# Patient Record
Sex: Male | Born: 1960 | Hispanic: No | Marital: Married | Smoking: Never smoker
Health system: Southern US, Community
[De-identification: ages and names within clinical notes are randomized; demographics above are authoritative.]

## PROBLEM LIST (undated history)

## (undated) DIAGNOSIS — I10 Essential (primary) hypertension: Secondary | ICD-10-CM

## (undated) DIAGNOSIS — A4902 Methicillin resistant Staphylococcus aureus infection, unspecified site: Secondary | ICD-10-CM

## (undated) DIAGNOSIS — G4730 Sleep apnea, unspecified: Secondary | ICD-10-CM

## (undated) DIAGNOSIS — K219 Gastro-esophageal reflux disease without esophagitis: Secondary | ICD-10-CM

## (undated) DIAGNOSIS — K439 Ventral hernia without obstruction or gangrene: Secondary | ICD-10-CM

## (undated) HISTORY — PX: HERNIA REPAIR: SHX51

## (undated) HISTORY — DX: Ventral hernia without obstruction or gangrene: K43.9

## (undated) HISTORY — PX: TONSILLECTOMY: SUR1361

## (undated) HISTORY — PX: APPENDECTOMY: SHX54

---

## 2005-10-14 DIAGNOSIS — A4902 Methicillin resistant Staphylococcus aureus infection, unspecified site: Secondary | ICD-10-CM

## 2005-10-14 HISTORY — PX: TOE AMPUTATION: SHX809

## 2005-10-14 HISTORY — DX: Methicillin resistant Staphylococcus aureus infection, unspecified site: A49.02

## 2008-10-14 HISTORY — PX: UMBILICAL HERNIA REPAIR: SHX196

## 2010-10-15 LAB — HM COLONOSCOPY: HM Colonoscopy: NORMAL

## 2013-02-19 ENCOUNTER — Ambulatory Visit: Payer: Self-pay | Admitting: Family Medicine

## 2013-04-23 LAB — PSA: PSA: 0.6

## 2013-08-04 ENCOUNTER — Ambulatory Visit: Payer: Self-pay | Admitting: Physician Assistant

## 2014-05-28 ENCOUNTER — Ambulatory Visit: Payer: Self-pay | Admitting: Family Medicine

## 2014-05-28 LAB — URINALYSIS, COMPLETE
Bilirubin,UR: NEGATIVE
GLUCOSE, UR: NEGATIVE
Ketone: NEGATIVE
NITRITE: POSITIVE
Ph: 6 (ref 5.0–8.0)
RBC,UR: >30 /HPF (ref 0–5)
Specific Gravity: 1.025 (ref 1.000–1.030)
Squamous Epithelial: NONE SEEN

## 2014-05-28 LAB — OCCULT BLOOD X 1 CARD TO LAB, STOOL: Occult Blood, Feces: NEGATIVE

## 2014-05-30 LAB — URINE CULTURE

## 2014-06-07 ENCOUNTER — Ambulatory Visit: Payer: Self-pay | Admitting: Emergency Medicine

## 2014-06-09 DIAGNOSIS — N3000 Acute cystitis without hematuria: Secondary | ICD-10-CM | POA: Insufficient documentation

## 2015-02-24 LAB — LIPID PANEL
CHOLESTEROL: 220 mg/dL — AB (ref 0–200)
HDL: 59 mg/dL (ref 35–70)
LDL CALC: 137 mg/dL
TRIGLYCERIDES: 121 mg/dL (ref 40–160)

## 2015-02-24 LAB — TSH: TSH: 1.4 u[IU]/mL (ref <=5.90)

## 2015-02-26 ENCOUNTER — Encounter: Payer: Self-pay | Admitting: Internal Medicine

## 2015-02-26 DIAGNOSIS — D485 Neoplasm of uncertain behavior of skin: Secondary | ICD-10-CM | POA: Insufficient documentation

## 2015-02-26 DIAGNOSIS — Z800 Family history of malignant neoplasm of digestive organs: Secondary | ICD-10-CM | POA: Insufficient documentation

## 2015-02-26 DIAGNOSIS — N401 Enlarged prostate with lower urinary tract symptoms: Secondary | ICD-10-CM

## 2015-02-26 DIAGNOSIS — N138 Other obstructive and reflux uropathy: Secondary | ICD-10-CM | POA: Insufficient documentation

## 2015-02-26 DIAGNOSIS — I10 Essential (primary) hypertension: Secondary | ICD-10-CM | POA: Insufficient documentation

## 2015-05-19 ENCOUNTER — Encounter: Payer: Self-pay | Admitting: Emergency Medicine

## 2015-05-19 ENCOUNTER — Ambulatory Visit
Admission: EM | Admit: 2015-05-19 | Discharge: 2015-05-19 | Disposition: A | Discharge status: Home / Self Care | Payer: BLUE CROSS/BLUE SHIELD | Attending: Family Medicine | Admitting: Family Medicine

## 2015-05-19 DIAGNOSIS — R42 Dizziness and giddiness: Secondary | ICD-10-CM | POA: Diagnosis not present

## 2015-05-19 DIAGNOSIS — H6982 Other specified disorders of Eustachian tube, left ear: Secondary | ICD-10-CM | POA: Diagnosis not present

## 2015-05-19 HISTORY — DX: Essential (primary) hypertension: I10

## 2015-05-19 MED ORDER — MECLIZINE HCL 25 MG PO TABS: 25.0000 mg | ORAL_TABLET | Freq: Three times a day (TID) | ORAL | Status: DC | PRN

## 2015-05-19 MED ORDER — FEXOFENADINE-PSEUDOEPHED ER 180-240 MG PO TB24: 1.0000 | ORAL_TABLET | Freq: Every day | ORAL | Status: DC

## 2015-05-19 MED ORDER — PREDNISONE 10 MG (21) PO TBPK: ORAL_TABLET | ORAL | Status: DC

## 2015-05-19 NOTE — ED Provider Notes (Signed)
CSN: [id number]     Arrival date & time 05/19/15  0702 History   First MD Initiated Contact with Patient 05/19/15 [phone]     Chief Complaint  Patient presents with  . Facial Pain   (Consider location/radiation/quality/duration/timing/severity/associated sxs/prior Treatment) Patient is a 54 y.o. male presenting with URI. The history is provided by the patient. No language interpreter was used.  URI Presenting symptoms: congestion, cough and ear pain   Presenting symptoms: no facial pain, no fatigue, no fever and no sore throat   Congestion:    Location:  Nasal   Interferes with sleep: no     Interferes with eating/drinking: no   Cough:    Cough characteristics:  Non-productive   Sputum characteristics:  Nondescript   Severity:  Mild   Onset quality:  Unable to specify   Timing:  Intermittent   Progression:  Waxing and waning Severity:  Moderate (Pressure in the L ear) Onset quality:  Unable to specify Timing:  Intermittent Progression:  Waxing and waning Chronicity:  New Relieved by:  Nothing Worsened by:  Nothing tried (flonase) Ineffective treatments: nasal spray. Associated symptoms: no sinus pain   Associated symptoms comment:  He reports having dizzyness in the AM the last three mornings. 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 Father w/colon CA . He has had a colonoscopy.  Past Medical History  Diagnosis Date  . Hypertension    Past Surgical History  Procedure Laterality Date  . 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 Appendectomy    . Tonsillectomy    . Umbilical hernia repair     Family History  Problem Relation Age of Onset  . Prostate cancer Father   . Colon cancer Father    History  Substance Use Topics  . Smoking status: Never Smoker   . Smokeless tobacco: Not on file  . Alcohol Use: 6.0 oz/week    10 Standard drinks or equivalent per week    Review of  Systems  Constitutional: Negative for fever and fatigue.  HENT: Positive for congestion, ear pain and postnasal drip. Negative for sinus pressure and sore throat.   Respiratory: Positive for cough.   All other systems reviewed and are negative.   Allergies  Codeine  Home Medications   Prior to Admission medications   Medication Sig Start Date End Date Taking? Authorizing Provider  fexofenadine-pseudoephedrine (ALLEGRA-D ALLERGY & CONGESTION) 180-240 MG per 24 hr tablet Take 1 tablet by mouth daily. 05/19/15   Frederich Cha, MD  fluticasone (FLONASE) 50 MCG/ACT nasal spray Place 2 sprays into the nose daily. 11/05/13   Historical Provider, MD  lisinopril (PRINIVIL,ZESTRIL) 10 MG tablet Take 1 tablet by mouth daily. 02/23/15   Historical Provider, MD  meclizine (ANTIVERT) 25 MG tablet Take 1 tablet (25 mg total) by mouth 3 (three) times daily as needed for dizziness. 05/19/15   Frederich Cha, MD  predniSONE (STERAPRED UNI-PAK 21 TAB) 10 MG (21) TBPK tablet Sig 6 tablet day 1, 5 tablets day 2, 4 tablets day 3,,3tablets day 4, 2 tablets day 5, 1 tablet day 6 take all tablets orally 05/19/15   Frederich Cha, MD  tamsulosin (FLOMAX) 0.4 MG CAPS capsule Take 0.4 mg by mouth daily as needed.    Historical Provider, MD   BP 129/92 mmHg  Pulse 50  Temp(Src) 96.5 F (35.8 C) (Tympanic)  Resp  16  Ht 5\' 9"  (1.753 m)  Wt 200 lb (90.719 kg)  BMI 29.52 kg/m2  SpO2 98% Physical Exam  ED Course  Procedures (including critical care time) Labs Review Labs Reviewed - No data to display  Imaging Review No results found.   MDM   1. Eustachian tube dysfunction, left   2. Vertigo    Will hold off on any antibiotic since this has been only 3-4 days in occurrence and not constant. Informed if not better in 7-10 days may at that time need an antibiotic.  Since he claims he has been using his flonase regularly will add a 6 day course of prednisone and antivert for his dizziness,.    Frederich Cha, MD 05/23/15  1153

## 2015-05-19 NOTE — Discharge Instructions (Signed)
Dizziness  Dizziness means you feel unsteady or lightheaded. You might feel like you are going to pass out (faint). HOME CARE   Drink enough fluids to keep your pee (urine) clear or pale yellow.  Take your medicines exactly as told by your doctor. If you take blood pressure medicine, always stand up slowly from the lying or sitting position. Hold on to something to steady yourself.  If you need to stand in one place for a long time, move your legs often. Tighten and relax your leg muscles.  Have someone stay with you until you feel okay.  Do not drive or use heavy machinery if you feel dizzy.  Do not drink alcohol. GET HELP RIGHT AWAY IF:   You feel dizzy or lightheaded and it gets worse.  You feel sick to your stomach (nauseous), or you throw up (vomit).  You have trouble talking or walking.  You feel weak or have trouble using your arms, hands, or legs.  You cannot think clearly or have trouble forming sentences.  You have chest pain, belly (abdominal) pain, sweating, or you are short of breath.  Your vision changes.  You are bleeding.  You have problems from your medicine that seem to be getting worse. MAKE SURE YOU:   Understand these instructions.  Will watch your condition.  Will get help right away if you are not doing well or get worse. Document Released: 09/19/2011 Document Revised: 12/23/2011 Document Reviewed: 09/19/2011 [facility] Patient Information 2015 [place], [place]. This information is not intended to replace advice given to you by your health care provider. Make sure you discuss any questions you have with your health care provider. Barotitis Media Barotitis media is inflammation of your middle ear. This occurs when the auditory tube (eustachian tube) leading from the back of your nose (nasopharynx) to your eardrum is blocked. This blockage may result from a cold, environmental allergies, or an upper respiratory infection. Unresolved barotitis media may  lead to damage or hearing loss (barotrauma), which may become permanent. HOME CARE INSTRUCTIONS   Use medicines as recommended by your health care provider. Over-the-counter medicines will help unblock the canal and can help during times of air travel.  Do not put anything into your ears to clean or unplug them. Eardrops will not be helpful.  Do not swim, dive, or fly until your health care provider says it is all right to do so. If these activities are necessary, chewing gum with frequent, forceful swallowing may help. It is also helpful to hold your nose and gently blow to pop your ears for equalizing pressure changes. This forces air into the eustachian tube.  Only take over-the-counter or prescription medicines for pain, discomfort, or fever as directed by your health care provider.  A decongestant may be helpful in decongesting the middle ear and make pressure equalization easier. SEEK MEDICAL CARE IF:  You experience a serious form of dizziness in which you feel as if the room is spinning and you feel nauseated (vertigo).  Your symptoms only involve one ear. SEEK IMMEDIATE MEDICAL CARE IF:   You develop a severe headache, dizziness, or severe ear pain.  You have bloody or pus-like drainage from your ears.  You develop a fever.  Your problems do not improve or become worse. MAKE SURE YOU:   Understand these instructions.  Will watch your condition.  Will get help right away if you are not doing well or get worse. Document Released: 09/27/2000 Document Revised: 07/21/2013 Document Reviewed: 04/27/2013  Exit[employer]® Patient Information ©2015 [employer]. This information is not intended to replace advice given to you by your health care provider. Make sure you discuss any questions you have with your health care provider. ° °

## 2015-05-19 NOTE — ED Notes (Signed)
Patient c/o sinus congestion and pressure for the past 3 days. Patient denies fevers.

## 2015-05-26 ENCOUNTER — Other Ambulatory Visit: Payer: Self-pay

## 2015-07-21 ENCOUNTER — Ambulatory Visit (INDEPENDENT_AMBULATORY_CARE_PROVIDER_SITE_OTHER): Payer: BLUE CROSS/BLUE SHIELD | Admitting: Internal Medicine

## 2015-07-21 ENCOUNTER — Encounter: Payer: Self-pay | Admitting: Internal Medicine

## 2015-07-21 VITALS — BP 104/70 | HR 68 | Ht 69.0 in | Wt 208.4 lb

## 2015-07-21 DIAGNOSIS — K439 Ventral hernia without obstruction or gangrene: Secondary | ICD-10-CM

## 2015-07-21 NOTE — Progress Notes (Signed)
Date:  07/21/2015   Name:  Isaac Banks   DOB:  1960-11-29   MRN:  [id number]   Chief Complaint: Hernia  Patient states about 10 years ago he had umbilical hernia repair with mesh. He had done well until several weeks ago he noticed a fullness and discomfort to the right side of his umbilicus. He has been trying to do more exercise and doing some sit ups. He does not recall any other specific injury. He has a tendency to be slightly constipated and has been trying to eat more salads. The discomfort is intermittent. The fullness is improved in the morning after he's been sleeping. He is concerned that it may progress and wants to have it evaluated.   Review of Systems  Constitutional: Negative for fever and chills.  Cardiovascular: Negative for chest pain.  Gastrointestinal: Positive for abdominal pain (mild intermittent discomfort), constipation and abdominal distention. Negative for vomiting and blood in stool.    Patient Active Problem List   Diagnosis Date Noted  . Benign prostatic hyperplasia with urinary obstruction 02/26/2015  . Essential (primary) hypertension 02/26/2015  . Family history of colon cancer 02/26/2015  . Neoplasm of uncertain behavior of skin 02/26/2015  . Acute cystitis 06/09/2014    Prior to Admission medications   Medication Sig Start Date End Date Taking? Authorizing Provider  fexofenadine-pseudoephedrine (ALLEGRA-D ALLERGY & CONGESTION) 180-240 MG per 24 hr tablet Take 1 tablet by mouth daily. 05/19/15  Yes Frederich Cha, MD  fluticasone [facility]) 50 MCG/ACT nasal spray Place 2 sprays into the nose daily. 11/05/13  Yes Historical Provider, MD  lisinopril (PRINIVIL,ZESTRIL) 10 MG tablet Take 1 tablet by mouth daily. 02/23/15  Yes Historical Provider, MD  sildenafil (REVATIO) 20 MG tablet 3-5 tablets po daily as needed 01/06/15  Yes Historical Provider, MD  tamsulosin (FLOMAX) 0.4 MG CAPS capsule Take 0.4 mg by mouth daily as needed.   Yes Historical Provider, MD     Allergies  Allergen Reactions  . Codeine     Past Surgical History  Procedure Laterality Date  . 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 Appendectomy    . Tonsillectomy    . Umbilical hernia repair      Social History  Substance Use Topics  . Smoking status: Never Smoker   . Smokeless tobacco: None  . Alcohol Use: 6.0 oz/week    10 Standard drinks or equivalent per week     Medication list has been reviewed and updated.  Physical Examination:  Physical Exam  Constitutional: He appears well-developed and well-nourished.  Cardiovascular: Normal rate and regular rhythm.   Pulmonary/Chest: Effort normal.  Abdominal: Soft. Bowel sounds are normal. There is tenderness in the periumbilical area. A hernia is present. Hernia confirmed positive in the ventral area.      BP 104/70 mmHg  Pulse 68  Ht 5\' 9"  (1.753 m)  Wt 208 lb 6.4 oz (94.53 kg)  BMI 30.76 kg/m2  Assessment and Plan: 1. Ventral hernia without obstruction or gangrene Patient to avoid sit-ups and heavy lifting without support Go to the emergency room if symptoms are acutely worse - Ambulatory referral to [place], MD [facility]  07/21/2015

## 2015-07-26 ENCOUNTER — Ambulatory Visit (INDEPENDENT_AMBULATORY_CARE_PROVIDER_SITE_OTHER): Payer: BLUE CROSS/BLUE SHIELD | Admitting: General Surgery

## 2015-07-26 ENCOUNTER — Encounter: Payer: Self-pay | Admitting: *Deleted

## 2015-07-26 ENCOUNTER — Other Ambulatory Visit: Payer: Self-pay | Admitting: *Deleted

## 2015-07-26 VITALS — BP 122/86 | HR 67 | Temp 98.3°F | Ht 69.0 in | Wt 212.0 lb

## 2015-07-26 DIAGNOSIS — K439 Ventral hernia without obstruction or gangrene: Secondary | ICD-10-CM | POA: Diagnosis not present

## 2015-07-26 NOTE — Progress Notes (Signed)
  Surgical Consultation  07/26/2015  Jasyah Theurer is an 54 y.o. male.   Chief Complaint  Patient presents with  . Hernia    surgical consult     HPI: 54 year old male reports to clinic for evaluation of a ventral hernia. Patient reports that approximately [place] an open umbilical hernia repair with mesh. Approximately 2 months ago while doing sit ups he noticed a painful bulge to the right side of his prior hernia repair. For approximately 10 days the only way to make reduce and feel better was to lay down. Since that time for the last several weeks the area has been soft and easily reducible. It is not currently tender however can become tender if manipulated heavily. He also complains of a fullness below his rib cage bilaterally. He denies any fevers, chills, nausea, vomiting, chest pain, shortness of breath, diarrhea, constipation. Otherwise in his usual state of good health.  Past Medical History  Diagnosis Date  . Hypertension   . Ventral hernia     Past Surgical History  Procedure Laterality Date  . 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 Appendectomy    . Tonsillectomy    . Umbilical hernia repair  2010    in [place]    Family History  Problem Relation Age of Onset  . Prostate cancer Father   . Colon cancer Father     Social History:  reports that he has never smoked. He has never used smokeless tobacco. He reports that he drinks about 6.0 oz of alcohol per week. He reports that he does not use illicit drugs.  Allergies:  Allergies  Allergen Reactions  . Codeine     Medications reviewed.     ROS A multisystem review of systems was completed. All pertinent positives negatives within the history of present illness the remainder negative.    BP 122/86 mmHg  Pulse 67  Temp(Src) 98.3 F (36.8 C) (Oral)  Ht 5\' 9"  (1.753 m)  Wt 96.163 kg (212 lb)  BMI 31.29 kg/m2  Physical Exam Gen.: No acute distress Chest: Clear  to auscultation  heart: Regular rate and rhythm Abdomen: Soft, nontender, nondistended. Palpable hernia to the right of prior umbilical hernia repair. This is easily reducible however difficult to ascertain fascial edges due to body habitus.   No results found for this or any previous visit (from the past 48 hour(s)). No results found.  Assessment/Plan: 1. Ventral hernia without obstruction or gangrene 54 year old male with a symptomatic ventral hernia. Discussed the surgical options of open versus laparoscopic repair. Went into detail into the procedures of both along with the risks, benefits, alternatives. Patient voiced understanding and stated desire to have the area fixed so that he could return to working out. Patient desires to have a laparoscopic ventral hernia repair. We'll plan for repair on October 28.   [name]

## 2015-07-26 NOTE — Patient Instructions (Signed)
You will be receiving a call from our surgery scheduler in the next few days detailing the date and time of your procedure. If you have any questions call Angie at [phone].

## 2015-07-27 ENCOUNTER — Telehealth: Payer: Self-pay | Admitting: General Surgery

## 2015-07-27 NOTE — Telephone Encounter (Signed)
Pt advised of pre op date/time and sx date. Sx: 08/11/15 with Dr Samuel Germany ventral hernia Pre op: 07/28/15 between 9-1:00pm--Phone.

## 2015-07-28 ENCOUNTER — Inpatient Hospital Stay: Admission: RE | Admit: 2015-07-28 | Payer: BLUE CROSS/BLUE SHIELD | Source: Ambulatory Visit

## 2015-07-28 ENCOUNTER — Other Ambulatory Visit: Payer: BLUE CROSS/BLUE SHIELD

## 2015-07-28 ENCOUNTER — Encounter: Payer: Self-pay | Admitting: *Deleted

## 2015-07-28 NOTE — Patient Instructions (Signed)
  Your procedure is scheduled on: 08-11-15 (FRIDAY) Report to [facility] To find out your arrival time please call [phone] between 1PM - 3PM on 08-10-15 (THURSDAY)  Remember: Instructions that are not followed completely may result in serious medical risk, up to and including death, or upon the discretion of your surgeon and anesthesiologist your surgery may need to be rescheduled.    _X___ 1. Do not eat food or drink liquids after midnight. No gum chewing or hard candies.     _X___ 2. No Alcohol for 24 hours before or after surgery.   ____ 3. Bring all medications with you on the day of surgery if instructed.    _X___ 4. Notify your doctor if there is any change in your medical condition     (cold, fever, infections).     Do not wear jewelry, make-up, hairpins, clips or nail polish.  Do not wear lotions, powders, or perfumes. You may wear deodorant.  Do not shave 48 hours prior to surgery. Men may shave face and neck.  Do not bring valuables to the hospital.    [facility] is not responsible for any belongings or valuables.               Contacts, dentures or bridgework may not be worn into surgery.  Leave your suitcase in the car. After surgery it may be brought to your room.  For patients admitted to the hospital, discharge time is determined by your  treatment team.   Patients discharged the day of surgery will not be allowed to drive home.   Please read over the following fact sheets that you were given:     _X___ Take these medicines the morning of surgery with A SIP OF WATER:    1. LISINOPRIL  2.   3.   4.  5.  6.  ____ Fleet Enema (as directed)   _X___ Use CHG Soap as directed  ____ Use inhalers on the day of surgery  ____ Stop metformin 2 days prior to surgery    ____ Take 1/2 of usual insulin dose the night before surgery and none on the morning of surgery.   ____ Stop Coumadin/Plavix/aspirin-N/A  ____ Stop  Anti-inflammatories-NO NSAIDS OR ASPIRIN PRODUCTS-TYLENOL OK   _X___ Stop supplements until after surgery-STOP PROBIOTIC NOW   ____ Bring C-Pap to the hospital.

## 2015-08-04 ENCOUNTER — Ambulatory Visit
Admission: RE | Admit: 2015-08-04 | Discharge: 2015-08-04 | Disposition: A | Discharge status: Home / Self Care | Payer: BLUE CROSS/BLUE SHIELD | Source: Ambulatory Visit | Attending: General Surgery | Admitting: General Surgery

## 2015-08-04 ENCOUNTER — Encounter
Admission: RE | Admit: 2015-08-04 | Discharge: 2015-08-04 | Disposition: A | Discharge status: Home / Self Care | Payer: BLUE CROSS/BLUE SHIELD | Source: Ambulatory Visit | Attending: General Surgery | Admitting: General Surgery

## 2015-08-04 DIAGNOSIS — Z01810 Encounter for preprocedural cardiovascular examination: Secondary | ICD-10-CM | POA: Insufficient documentation

## 2015-08-04 DIAGNOSIS — I517 Cardiomegaly: Secondary | ICD-10-CM | POA: Diagnosis not present

## 2015-08-04 LAB — BASIC METABOLIC PANEL
ANION GAP: 6 (ref 5–15)
BUN: 19 mg/dL (ref 6–20)
CHLORIDE: 107 mmol/L (ref 101–111)
CO2: 26 mmol/L (ref 22–32)
Calcium: 9.1 mg/dL (ref 8.9–10.3)
Creatinine, Ser: 1.02 mg/dL (ref 0.61–1.24)
GFR calc Af Amer: >60 mL/min (ref >60)
GFR calc non Af Amer: >60 mL/min (ref >60)
GLUCOSE: 101 mg/dL — AB (ref 65–99)
Potassium: 4.3 mmol/L (ref 3.5–5.1)
Sodium: 139 mmol/L (ref 135–145)

## 2015-08-04 LAB — CBC WITH DIFFERENTIAL/PLATELET
Basophils Absolute: 0.1 10*3/uL (ref 0–0.1)
Basophils Relative: 1 %
EOS PCT: 4 %
Eosinophils Absolute: 0.5 10*3/uL (ref 0–0.7)
HCT: 45.8 % (ref 40.0–52.0)
Hemoglobin: 15.4 g/dL (ref 13.0–18.0)
LYMPHS ABS: 3 10*3/uL (ref 1.0–3.6)
LYMPHS PCT: 29 %
MCH: 29.5 pg (ref 26.0–34.0)
MCHC: 33.5 g/dL (ref 32.0–36.0)
MCV: 88.1 fL (ref 80.0–100.0)
MONO ABS: 0.7 10*3/uL (ref 0.2–1.0)
Monocytes Relative: 7 %
Neutro Abs: 6.2 10*3/uL (ref 1.4–6.5)
Neutrophils Relative %: 59 %
Platelets: 247 10*3/uL (ref 150–440)
RBC: 5.21 MIL/uL (ref 4.40–5.90)
RDW: 12.9 % (ref 11.5–14.5)
WBC: 10.5 10*3/uL (ref 3.8–10.6)

## 2015-08-04 LAB — PROTIME-INR
INR: 0.95
Prothrombin Time: 12.9 seconds (ref 11.4–15.0)

## 2015-08-04 LAB — SURGICAL PCR SCREEN
MRSA, PCR: NEGATIVE
Staphylococcus aureus: NEGATIVE

## 2015-08-04 LAB — APTT: aPTT: 29 seconds (ref 24–36)

## 2015-08-10 ENCOUNTER — Encounter: Payer: Self-pay | Admitting: Internal Medicine

## 2015-08-10 DIAGNOSIS — I517 Cardiomegaly: Secondary | ICD-10-CM | POA: Insufficient documentation

## 2015-08-10 NOTE — Pre-Procedure Instructions (Signed)
Surgeon ordered a CXR on pt preop and it came back that pt has Cardiomegaly.  Pt does have htn and sleep apnea and does not wear his cpap-faxed results to Dr Army Melia (pcp) just for her to have in her records

## 2015-08-11 ENCOUNTER — Ambulatory Visit: Payer: BLUE CROSS/BLUE SHIELD | Admitting: Certified Registered Nurse Anesthetist

## 2015-08-11 ENCOUNTER — Encounter
Admission: RE | Disposition: A | Discharge status: Home / Self Care | Payer: Self-pay | Source: Ambulatory Visit | Attending: General Surgery

## 2015-08-11 ENCOUNTER — Ambulatory Visit
Admission: RE | Admit: 2015-08-11 | Discharge: 2015-08-11 | Disposition: A | Discharge status: Home / Self Care | Payer: BLUE CROSS/BLUE SHIELD | Source: Ambulatory Visit | Attending: General Surgery | Admitting: General Surgery

## 2015-08-11 ENCOUNTER — Encounter: Payer: Self-pay | Admitting: *Deleted

## 2015-08-11 DIAGNOSIS — K458 Other specified abdominal hernia without obstruction or gangrene: Secondary | ICD-10-CM | POA: Diagnosis not present

## 2015-08-11 DIAGNOSIS — Z8614 Personal history of Methicillin resistant Staphylococcus aureus infection: Secondary | ICD-10-CM | POA: Insufficient documentation

## 2015-08-11 DIAGNOSIS — I10 Essential (primary) hypertension: Secondary | ICD-10-CM | POA: Diagnosis not present

## 2015-08-11 DIAGNOSIS — Z9049 Acquired absence of other specified parts of digestive tract: Secondary | ICD-10-CM | POA: Diagnosis not present

## 2015-08-11 DIAGNOSIS — G4730 Sleep apnea, unspecified: Secondary | ICD-10-CM | POA: Diagnosis not present

## 2015-08-11 DIAGNOSIS — Z800 Family history of malignant neoplasm of digestive organs: Secondary | ICD-10-CM | POA: Insufficient documentation

## 2015-08-11 DIAGNOSIS — Z79899 Other long term (current) drug therapy: Secondary | ICD-10-CM | POA: Insufficient documentation

## 2015-08-11 DIAGNOSIS — Z89422 Acquired absence of other left toe(s): Secondary | ICD-10-CM | POA: Insufficient documentation

## 2015-08-11 DIAGNOSIS — K432 Incisional hernia without obstruction or gangrene: Secondary | ICD-10-CM | POA: Diagnosis not present

## 2015-08-11 DIAGNOSIS — K469 Unspecified abdominal hernia without obstruction or gangrene: Secondary | ICD-10-CM | POA: Insufficient documentation

## 2015-08-11 DIAGNOSIS — Z8042 Family history of malignant neoplasm of prostate: Secondary | ICD-10-CM | POA: Insufficient documentation

## 2015-08-11 HISTORY — DX: Sleep apnea, unspecified: G47.30

## 2015-08-11 HISTORY — DX: Methicillin resistant Staphylococcus aureus infection, unspecified site: A49.02

## 2015-08-11 HISTORY — PX: VENTRAL HERNIA REPAIR: SHX424

## 2015-08-11 SURGERY — REPAIR, HERNIA, VENTRAL, LAPAROSCOPIC
Anesthesia: General | Wound class: Clean

## 2015-08-11 MED ORDER — MIDAZOLAM HCL 2 MG/2ML IJ SOLN
INTRAMUSCULAR | Status: DC | PRN
  Administered 2015-08-11: 2 mg via INTRAVENOUS

## 2015-08-11 MED ORDER — ROCURONIUM BROMIDE 100 MG/10ML IV SOLN
INTRAVENOUS | Status: DC | PRN
  Administered 2015-08-11: 10 mg via INTRAVENOUS
  Administered 2015-08-11: 15 mg via INTRAVENOUS

## 2015-08-11 MED ORDER — HEPARIN SODIUM (PORCINE) 5000 UNIT/ML IJ SOLN
INTRAMUSCULAR | Status: AC
Start: 2015-08-11 — End: 2015-08-11
  Filled 2015-08-11: qty 1

## 2015-08-11 MED ORDER — SODIUM CHLORIDE 0.9 % IJ SOLN
INTRAMUSCULAR | Status: AC
  Filled 2015-08-11: qty 10

## 2015-08-11 MED ORDER — LIDOCAINE HCL 1 % IJ SOLN
INTRAMUSCULAR | Status: DC | PRN
  Administered 2015-08-11: 10 mL

## 2015-08-11 MED ORDER — FENTANYL CITRATE (PF) 100 MCG/2ML IJ SOLN
INTRAMUSCULAR | Status: DC | PRN
  Administered 2015-08-11: 100 ug via INTRAVENOUS

## 2015-08-11 MED ORDER — FENTANYL CITRATE (PF) 100 MCG/2ML IJ SOLN
INTRAMUSCULAR | Status: AC
  Filled 2015-08-11: qty 2

## 2015-08-11 MED ORDER — CHLORHEXIDINE GLUCONATE 4 % EX LIQD: 1.0000 "application " | Freq: Once | CUTANEOUS | Status: DC

## 2015-08-11 MED ORDER — DEXAMETHASONE SODIUM PHOSPHATE 4 MG/ML IJ SOLN
INTRAMUSCULAR | Status: DC | PRN
  Administered 2015-08-11: 5 mg via INTRAVENOUS

## 2015-08-11 MED ORDER — FENTANYL CITRATE (PF) 100 MCG/2ML IJ SOLN
25.0000 ug | INTRAMUSCULAR | Status: DC | PRN
  Administered 2015-08-11 (×3): 25 ug via INTRAVENOUS

## 2015-08-11 MED ORDER — CEFAZOLIN SODIUM-DEXTROSE 2-3 GM-% IV SOLR
2.0000 g | INTRAVENOUS | Status: AC
  Administered 2015-08-11: 2 g via INTRAVENOUS

## 2015-08-11 MED ORDER — KETOROLAC TROMETHAMINE 30 MG/ML IJ SOLN
INTRAMUSCULAR | Status: DC | PRN
  Administered 2015-08-11: 30 mg via INTRAVENOUS

## 2015-08-11 MED ORDER — DOCUSATE SODIUM 100 MG PO CAPS: 100.0000 mg | ORAL_CAPSULE | Freq: Two times a day (BID) | ORAL | Status: DC

## 2015-08-11 MED ORDER — OXYCODONE HCL 5 MG/5ML PO SOLN: 5.0000 mg | Freq: Once | ORAL | Status: DC | PRN

## 2015-08-11 MED ORDER — PROPOFOL 10 MG/ML IV BOLUS
INTRAVENOUS | Status: DC | PRN
  Administered 2015-08-11: 200 mg via INTRAVENOUS

## 2015-08-11 MED ORDER — GLYCOPYRROLATE 0.2 MG/ML IJ SOLN
INTRAMUSCULAR | Status: DC | PRN
  Administered 2015-08-11: 0.4 mg via INTRAVENOUS

## 2015-08-11 MED ORDER — LIDOCAINE HCL (PF) 1 % IJ SOLN
INTRAMUSCULAR | Status: AC
  Filled 2015-08-11: qty 30

## 2015-08-11 MED ORDER — FAMOTIDINE 20 MG PO TABS
20.0000 mg | ORAL_TABLET | Freq: Once | ORAL | Status: AC
  Administered 2015-08-11: 20 mg via ORAL

## 2015-08-11 MED ORDER — ONDANSETRON 4 MG PO TBDP
4.0000 mg | ORAL_TABLET | Freq: Three times a day (TID) | ORAL | Status: DC | PRN
Start: 2015-08-11 — End: 2015-08-17

## 2015-08-11 MED ORDER — BUPIVACAINE HCL (PF) 0.5 % IJ SOLN
INTRAMUSCULAR | Status: AC
  Filled 2015-08-11: qty 30

## 2015-08-11 MED ORDER — PROMETHAZINE HCL 25 MG/ML IJ SOLN
INTRAMUSCULAR | Status: AC
  Administered 2015-08-11: 12.5 mg via INTRAVENOUS
  Filled 2015-08-11: qty 1

## 2015-08-11 MED ORDER — NEOSTIGMINE METHYLSULFATE 10 MG/10ML IV SOLN
INTRAVENOUS | Status: DC | PRN
  Administered 2015-08-11: 3 mg via INTRAVENOUS

## 2015-08-11 MED ORDER — ACETAMINOPHEN 10 MG/ML IV SOLN
INTRAVENOUS | Status: AC
  Filled 2015-08-11: qty 100

## 2015-08-11 MED ORDER — BUPIVACAINE HCL (PF) 0.5 % IJ SOLN
INTRAMUSCULAR | Status: DC | PRN
  Administered 2015-08-11: 10 mL

## 2015-08-11 MED ORDER — OXYCODONE HCL 5 MG PO TABS: 5.0000 mg | ORAL_TABLET | Freq: Once | ORAL | Status: DC | PRN

## 2015-08-11 MED ORDER — LACTATED RINGERS IV SOLN
INTRAVENOUS | Status: DC
  Administered 2015-08-11: 08:00:00 via INTRAVENOUS

## 2015-08-11 MED ORDER — PROMETHAZINE HCL 25 MG/ML IJ SOLN
12.5000 mg | Freq: Once | INTRAMUSCULAR | Status: AC
  Administered 2015-08-11: 12.5 mg via INTRAVENOUS

## 2015-08-11 MED ORDER — ONDANSETRON HCL 4 MG/2ML IJ SOLN
INTRAMUSCULAR | Status: DC | PRN
  Administered 2015-08-11: 4 mg via INTRAVENOUS

## 2015-08-11 MED ORDER — CEFAZOLIN SODIUM-DEXTROSE 2-3 GM-% IV SOLR
INTRAVENOUS | Status: AC
  Filled 2015-08-11: qty 50

## 2015-08-11 MED ORDER — SUCCINYLCHOLINE CHLORIDE 20 MG/ML IJ SOLN
INTRAMUSCULAR | Status: DC | PRN
  Administered 2015-08-11: 140 mg via INTRAVENOUS

## 2015-08-11 MED ORDER — LIDOCAINE HCL (CARDIAC) 20 MG/ML IV SOLN
INTRAVENOUS | Status: DC | PRN
  Administered 2015-08-11: 100 mg via INTRAVENOUS

## 2015-08-11 MED ORDER — HYDROCODONE-ACETAMINOPHEN 5-325 MG PO TABS: 1.0000 | ORAL_TABLET | Freq: Four times a day (QID) | ORAL | Status: DC | PRN

## 2015-08-11 MED ORDER — ACETAMINOPHEN 10 MG/ML IV SOLN
INTRAVENOUS | Status: DC | PRN
  Administered 2015-08-11: 1000 mg via INTRAVENOUS

## 2015-08-11 MED ORDER — FAMOTIDINE 20 MG PO TABS
ORAL_TABLET | ORAL | Status: AC
  Filled 2015-08-11: qty 1

## 2015-08-11 SURGICAL SUPPLY — 34 items
CANISTER SUCT 1200ML W/VALVE (MISCELLANEOUS) ×3 IMPLANT
CANNULA DILATOR  5MM W/SLV (CANNULA) ×1
CANNULA DILATOR 12 W/SLV (CANNULA) ×2 IMPLANT
CANNULA DILATOR 12MM W/SLV (CANNULA) ×1
CANNULA DILATOR 5 W/SLV (CANNULA) ×2 IMPLANT
CATH TRAY 16F METER LATEX (MISCELLANEOUS) IMPLANT
CHLORAPREP W/TINT 26ML (MISCELLANEOUS) ×3 IMPLANT
DEVICE SECURE STRAP 25 ABSORB (INSTRUMENTS) ×3 IMPLANT
GAUZE SPONGE 4X4 12PLY STRL (GAUZE/BANDAGES/DRESSINGS) ×3 IMPLANT
GLOVE BIO SURGEON STRL SZ7.5 (GLOVE) ×3 IMPLANT
GOWN STRL REUS W/ TWL LRG LVL3 (GOWN DISPOSABLE) ×2 IMPLANT
GOWN STRL REUS W/TWL LRG LVL3 (GOWN DISPOSABLE) ×4
IRRIGATION STRYKERFLOW (MISCELLANEOUS) IMPLANT
IRRIGATOR STRYKERFLOW (MISCELLANEOUS)
IV NS 1000ML (IV SOLUTION) ×2
IV NS 1000ML BAXH (IV SOLUTION) ×1 IMPLANT
KIT RM TURNOVER STRD PROC AR (KITS) ×3 IMPLANT
LABEL OR SOLS (LABEL) ×3 IMPLANT
MESH VENT ST 7.6CM S CIR (Mesh General) ×3 IMPLANT
NEEDLE FILTER BLUNT 18X 1/2SAF (NEEDLE) ×2
NEEDLE FILTER BLUNT 18X1 1/2 (NEEDLE) ×1 IMPLANT
NS IRRIG 500ML POUR BTL (IV SOLUTION) ×3 IMPLANT
PACK LAP CHOLECYSTECTOMY (MISCELLANEOUS) ×3 IMPLANT
PAD GROUND ADULT SPLIT (MISCELLANEOUS) ×3 IMPLANT
SCISSORS METZENBAUM CVD 33 (INSTRUMENTS) ×3 IMPLANT
SEAL FOR SCOPE WARMER C3101 (MISCELLANEOUS) IMPLANT
SUT ETHILON 5-0 FS-2 18 BLK (SUTURE) IMPLANT
SUT VIC AB 0 CT2 27 (SUTURE) ×3 IMPLANT
SUT VIC AB 2-0 CT2 27 (SUTURE) IMPLANT
SYR 5ML LL (SYRINGE) ×3 IMPLANT
TACKER 5MM HERNIA 3.5CML NAB (ENDOMECHANICALS) IMPLANT
TROCAR XCEL NON-BLD 11X100MML (ENDOMECHANICALS) ×3 IMPLANT
TUBING INSUFFLATOR HI FLOW (MISCELLANEOUS) ×3 IMPLANT
WATER STERILE IRR 1000ML POUR (IV SOLUTION) ×3 IMPLANT

## 2015-08-11 NOTE — Discharge Instructions (Addendum)
Laparoscopic Ventral Hernia Repair, Care After Refer to this sheet in the next few weeks. These instructions provide you with information on caring for yourself after your procedure. Your caregiver may also give you more specific instructions. Your treatment has been planned according to current medical practices, but problems sometimes occur. Call your caregiver if you have any problems or questions after your procedure.  HOME CARE INSTRUCTIONS   Only take over-the-counter or prescription medicines as directed by your caregiver. If antibiotic medicines are prescribed, take them as directed. Finish them even if you start to feel better.  Always wash your hands before touching your abdomen.  Take your bandages (dressings) off after 48 hours or as directed by your caregiver. You may have skin adhesive strips or skin glue over the surgical cuts (incisions). Do not take the strips off or peel the glue off. These will fall off on their own.  OK to Take showers in 24 hours.Until then, only take sponge baths. Do not take tub baths or go swimming until your caregiver approves.  Check your incision area every day for swelling, redness, warmth, and blood or fluid leaking from the incision. These are signs of infection. Wash your hands before you check.  Hold a pillow over your abdomen when you cough or sneeze to help ease pain.  Eat foods that are high in fiber, such as whole grains, fruits, and vegetables. Fiber helps prevent difficult bowel movements (constipation).  Drink enough fluids to keep your urine clear or pale yellow.  Rest and lessen activity for 4-5 days after the surgery. You may take short walks if your caregiver approves. Do not drive until approved by your caregiver.  Do not lift anything heavy, participate in sports, or have sexual intercourse for 6-8 weeks or until your caregiver approves.   Ask your caregiver when you can return to work.  Keep all follow-up appointments. SEEK  MEDICAL CARE IF:   You have pain even after taking pain medicine.  You have not had a bowel movement in 3 days.  You have cramps or are nauseous. SEEK IMMEDIATE MEDICAL CARE IF:   You have pain or swelling that is getting worse.  You have redness around your incisions.  Your incision is pulling apart.  You have blood or fluid leaking from your incisions.  You are vomiting.  You cannot pass urine. MAKE SURE YOU:   Understand these instructions.  Will watch your condition.  Will get help right away if you are not doing well or get worse.   This information is not intended to replace advice given to you by your health care provider. Make sure you discuss any questions you have with your health care provider.   Document Released: 09/16/2012 Document Revised: 10/21/2014 Document Reviewed: 09/16/2012 Elsevier Interactive Patient Education 2016 [place]   1) The drugs that you were given will stay in your system until tomorrow so for the next 24 hours you should not:  A) Drive an automobile B) Make any legal decisions C) Drink any alcoholic beverage   2) You may resume regular meals tomorrow.  Today it is better to start with liquids and gradually work up to solid foods.  You may eat anything you prefer, but it is better to start with liquids, then soup and crackers, and gradually work up to solid foods.   3) Please notify your doctor immediately if you have any unusual bleeding, trouble breathing,  redness and pain at the surgery site, drainage, fever, or pain not relieved by medication.

## 2015-08-11 NOTE — Anesthesia Preprocedure Evaluation (Addendum)
Anesthesia Evaluation  Patient identified by MRN, date of birth, ID band Patient awake    Reviewed: Allergy & Precautions, H&P , NPO status , Patient's Chart, lab work & pertinent test results  History of Anesthesia Complications (+) history of anesthetic complications (constipation)  Airway Mallampati: III  TM Distance: >3 FB Neck ROM: limited    Dental  (+) Poor Dentition, Chipped   Pulmonary neg shortness of breath, sleep apnea ,    Pulmonary exam normal breath sounds clear to auscultation       Cardiovascular Exercise Tolerance: Good hypertension, (-) angina(-) DOE Normal cardiovascular exam Rhythm:regular Rate:Normal     Neuro/Psych negative neurological ROS  negative psych ROS   GI/Hepatic negative GI ROS, Neg liver ROS,   Endo/Other  negative endocrine ROS  Renal/GU negative Renal ROS  negative genitourinary   Musculoskeletal   Abdominal   Peds  Hematology negative hematology ROS (+)   Anesthesia Other Findings Past Medical History:   Hypertension                                                 Ventral hernia                                               Complication of anesthesia                                   MRSA infection                                  2007         Sleep apnea                                                    Comment:PT DOES NOT USE CPAP  Past Surgical History:   TOE AMPUTATION                                  Left 2007           Comment:2nd, 3rd and 4th- PT HAD 6 SURGERIES ON HIS               FOOT   APPENDECTOMY                                                  TONSILLECTOMY                                                 UMBILICAL HERNIA REPAIR  2010           Comment:in [place]     Reproductive/Obstetrics negative OB ROS                            Anesthesia Physical Anesthesia Plan  ASA: III  Anesthesia Plan:  General ETT   Post-op Pain Management:    Induction:   Airway Management Planned:   Additional Equipment:   Intra-op Plan:   Post-operative Plan:   Informed Consent: I have reviewed the patients History and Physical, chart, labs and discussed the procedure including the risks, benefits and alternatives for the proposed anesthesia with the patient or authorized representative who has indicated his/her understanding and acceptance.   Dental Advisory Given  Plan Discussed with: Anesthesiologist, CRNA and Surgeon  Anesthesia Plan Comments:         Anesthesia Quick Evaluation

## 2015-08-11 NOTE — Transfer of Care (Signed)
Immediate Anesthesia Transfer of Care Note  Patient: Isaac Banks  Procedure(s) Performed: Procedure(s): LAPAROSCOPIC VENTRAL HERNIA WITH MESH  (N/A)  Patient Location: PACU  Anesthesia Type:General  Level of Consciousness: sedated  Airway & Oxygen Therapy: Patient Spontanous Breathing and Patient connected to nasal cannula oxygen  Post-op Assessment: Report given to RN and Post -op Vital signs reviewed and stable  Post vital signs: Reviewed and stable  Last Vitals:  Filed Vitals:   08/11/15 1018  BP: 146/97  Pulse: 57  Temp: 36.1 C  Resp: 12    Complications: No apparent anesthesia complications

## 2015-08-11 NOTE — Anesthesia Postprocedure Evaluation (Signed)
  Anesthesia Post-op Note  Patient: Isaac Banks  Procedure(s) Performed: Procedure(s): LAPAROSCOPIC VENTRAL HERNIA WITH MESH  (N/A)  Anesthesia type:General ETT  Patient location: PACU  Post pain: Pain level controlled  Post assessment: Post-op Vital signs reviewed, Patient's Cardiovascular Status Stable, Respiratory Function Stable, Patent Airway and No signs of Nausea or vomiting  Post vital signs: Reviewed and stable  Last Vitals:  Filed Vitals:   08/11/15 1218  BP: 100/69  Pulse: 70  Temp:   Resp: 14    Level of consciousness: awake, alert  and patient cooperative  Complications: No apparent anesthesia complications

## 2015-08-11 NOTE — Op Note (Signed)
Laparoscopic Abdominal Hernia Repair  Pre-operative Diagnosis: Recurrent Ventral Hernia   Post-operative Diagnosis: Same  Surgeon: Juanda Crumble T. Adonis Huguenin, MD FACS  Anesthesia: Gen. with endotracheal tube  Assistant:None  Procedure Details  The patient was seen again in the Holding Room. The benefits, complications, treatment options, and expected outcomes were discussed with the patient. The risks of bleeding, infection, recurrence of symptoms, failure to resolve symptoms, bowel injury, mesh placement, mesh infection, any of which could require further surgery were reviewed with the patient. The likelihood of improving the patient's symptoms with return to their baseline status is good.  The patient and/or family concurred with the proposed plan, giving informed consent.  The patient was taken to Operating Room, identified as Markelle Najarian and the procedure verified.  A Time Out was held and the above information confirmed.  Prior to the induction of general anesthesia, antibiotic prophylaxis was administered. VTE prophylaxis was in place. General endotracheal anesthesia was then administered and tolerated well. After the induction, the abdomen was prepped with Chloraprep and draped in the sterile fashion. The patient was positioned in the supine position.   Procedure began with a left upper quadrant Veress needle approach. An area in the midclavicular line left quadrant was chosen 2 finger breaths below the most rib. It was localized local anesthetic. Then incised with a 11 blade scalpel and using a Veress needle and pneumoperitoneum was established with 15 mmHg. Then using a 5 mg to be trocar axis of the pneumoperitoneum was established. There is no evidence of damage from the Veress needle or after trocar upon insertion. We then placed 2 additional trochars under direct visualization all on the left lateral axillary line. A mid abdomen 5 only turgor and a left lower quadrant 11 mm trocar. Both under  direct position without any difficulty.   Then turned attention to the mid abdomen where a prior suture repair of a presumed ventral/umbilical hernia was visualized. There was an obvious 1 cm fascial defect just cephalad to this hernia repair. There was an additional area of preperitoneal fat hanging from an opening near that was retracted and removed using electrocautery. This point the decision was made to reinforce the entire area of the prior hernia repair with mesh. A entry oh ST hernia patch was brought into the field that was 4 cm in diameter. A single 0 Vicryl was placed in the center of this mesh. The mesh was rolled up and placed in the abdomen through the left lower quadrant trocar site. Then using a suture grasper the sutures for the mesh was grasped in the center of the hernia and brought up to cover the fascial defect. This was tied down as the lone fascial suture. Then using a secure strap tacking device and was circumferentially secured to the fascia and 2 rings wide of the hernia defect.  The hernia was completely covered pneumoperitoneum was released and there is no evidence of any damage from the surgery. Our trochars removed under direct visualization without evidence of active bleeding and without difficulty. All 4 trochars removed re-localized with the aforementioned local anesthetic. All surgical sites were sealed with a septic a 4-0 Monocryl and sealed with Dermabond.  Patient was awoken from general tracheal anesthesia Without any difficulty. He tolerated procedure well was no immediate evidence, occasion. All counts were correct at the end of case.  Findings:   1 cm recurrent ventral hernia.  Estimated Blood Loss: 5 mL         Drains: None  Specimens: None          Complications: none              Leeah Politano T. Adonis Huguenin, MD, FACS

## 2015-08-11 NOTE — H&P (View-Only) (Signed)
  Surgical Consultation  07/26/2015  Iori Gigante is an 54 y.o. male.   Chief Complaint  Patient presents with  . Hernia    surgical consult     HPI: 54 year old male reports to clinic for evaluation of a ventral hernia. Patient reports that approximately 10 [place] an open umbilical hernia repair with mesh. Approximately 2 months ago while doing sit ups he noticed a painful bulge to the right side of his prior hernia repair. For approximately 10 days the only way to make reduce and feel better was to lay down. Since that time for the last several weeks the area has been soft and easily reducible. It is not currently tender however can become tender if manipulated heavily. He also complains of a fullness below his rib cage bilaterally. He denies any fevers, chills, nausea, vomiting, chest pain, shortness of breath, diarrhea, constipation. Otherwise in his usual state of good health.  Past Medical History  Diagnosis Date  . Hypertension   . Ventral hernia     Past Surgical History  Procedure Laterality Date  . 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 Appendectomy    . Tonsillectomy    . Umbilical hernia repair  2010    in [place]    Family History  Problem Relation Age of Onset  . Prostate cancer Father   . Colon cancer Father     Social History:  reports that he has never smoked. He has never used smokeless tobacco. He reports that he drinks about 6.0 oz of alcohol per week. He reports that he does not use illicit drugs.  Allergies:  Allergies  Allergen Reactions  . Codeine     Medications reviewed.     ROS A multisystem review of systems was completed. All pertinent positives negatives within the history of present illness the remainder negative.    BP 122/86 mmHg  Pulse 67  Temp(Src) 98.3 F (36.8 C) (Oral)  Ht 5\' 9"  (1.753 m)  Wt 96.163 kg (212 lb)  BMI 31.29 kg/m2  Physical Exam Gen.: No acute distress Chest: Clear  to auscultation  heart: Regular rate and rhythm Abdomen: Soft, nontender, nondistended. Palpable hernia to the right of prior umbilical hernia repair. This is easily reducible however difficult to ascertain fascial edges due to body habitus.   No results found for this or any previous visit (from the past 48 hour(s)). No results found.  Assessment/Plan: 1. Ventral hernia without obstruction or gangrene 54 year old male with a symptomatic ventral hernia. Discussed the surgical options of open versus laparoscopic repair. Went into detail into the procedures of both along with the risks, benefits, alternatives. Patient voiced understanding and stated desire to have the area fixed so that he could return to working out. Patient desires to have a laparoscopic ventral hernia repair. We'll plan for repair on October 28.   [name]

## 2015-08-11 NOTE — Anesthesia Procedure Notes (Signed)
Procedure Name: Intubation Date/Time: 08/11/2015 9:16 AM Performed by: Aline Brochure Pre-anesthesia Checklist: Patient identified, Emergency Drugs available, Suction available and Patient being monitored Patient Re-evaluated:Patient Re-evaluated prior to inductionOxygen Delivery Method: Circle system utilized Preoxygenation: Pre-oxygenation with 100% oxygen Intubation Type: IV induction Ventilation: Oral airway inserted - appropriate to patient size Laryngoscope Size: McGraph and 4 Grade View: Grade I Tube type: Oral Tube size: 7.5 mm Number of attempts: 1 Airway Equipment and Method: Patient positioned with wedge pillow,  Video-laryngoscopy and Stylet Placement Confirmation: ETT inserted through vocal cords under direct vision,  positive ETCO2 and breath sounds checked- equal and bilateral Secured at: 22 cm Tube secured with: Tape Dental Injury: Teeth and Oropharynx as per pre-operative assessment

## 2015-08-11 NOTE — Interval H&P Note (Signed)
History and Physical Interval Note:  08/11/2015 8:35 AM  Isaac Banks  has presented today for surgery, with the diagnosis of Ventral Hernia  The various methods of treatment have been discussed with the patient and family. After consideration of risks, benefits and other options for treatment, the patient has consented to  Procedure(s): LAPAROSCOPIC VENTRAL HERNIA (N/A) as a surgical intervention .  The patient's history has been reviewed, patient examined, no change in status, stable for surgery.  I have reviewed the patient's chart and labs.  Questions were answered to the patient's satisfaction.     [name]

## 2015-08-11 NOTE — Brief Op Note (Signed)
08/11/2015  10:13 AM  PATIENT:  Bryson Corona  54 y.o. male  PRE-OPERATIVE DIAGNOSIS:  Ventral Hernia  POST-OPERATIVE DIAGNOSIS:  Ventral Hernia  PROCEDURE:  Procedure(s): LAPAROSCOPIC VENTRAL HERNIA WITH MESH  (N/A)  SURGEON:  Surgeon(s) and Role:    * [name], MD - Primary  PHYSICIAN ASSISTANT:   ASSISTANTS: none   ANESTHESIA:   general  EBL:  Total I/O In: 900 [I.V.:900] Out: 0   BLOOD ADMINISTERED:none  DRAINS: none   LOCAL MEDICATIONS USED:  MARCAINE   , XYLOCAINE  and Amount: 20 ml  SPECIMEN:  No Specimen  DISPOSITION OF SPECIMEN:  N/A  COUNTS:  YES  TOURNIQUET:  * No tourniquets in log *  DICTATION: .Dragon Dictation  PLAN OF CARE: Discharge to home after PACU  PATIENT DISPOSITION:  PACU - hemodynamically stable.   Delay start of Pharmacological VTE agent (>24hrs) due to surgical blood loss or risk of bleeding: not applicable

## 2015-08-14 ENCOUNTER — Encounter: Payer: Self-pay | Admitting: General Surgery

## 2015-08-17 ENCOUNTER — Encounter: Payer: Self-pay | Admitting: General Surgery

## 2015-08-17 ENCOUNTER — Ambulatory Visit (INDEPENDENT_AMBULATORY_CARE_PROVIDER_SITE_OTHER): Payer: BLUE CROSS/BLUE SHIELD | Admitting: General Surgery

## 2015-08-17 VITALS — BP 123/75 | HR 62 | Temp 98.2°F | Ht 69.0 in | Wt 207.0 lb

## 2015-08-17 DIAGNOSIS — Z4889 Encounter for other specified surgical aftercare: Secondary | ICD-10-CM | POA: Insufficient documentation

## 2015-08-17 NOTE — Progress Notes (Signed)
Outpatient Surgical Follow Up  08/17/2015  Isaac Banks is an 54 y.o. male.   Chief Complaint  Patient presents with  . Routine Post Op    Laparoscopic Ventral Hernia Repair 08/11/2015 Dr. Adonis Huguenin     HPI: 54 year old male returns to clinic 1 week status post laparoscopic hernia repair. Patient reports some pain center of the hernia repair site and abdominal muscle spasm. He has stopped taking pain medications last week, has been eating a regular diet, having bowel movements but had been hard. Denies any fevers, chills, nausea, vomiting, chest pain, shortness of breath. Desires return to work next week.  Past Medical History  Diagnosis Date  . Hypertension   . Ventral hernia   . Complication of anesthesia   . MRSA infection 2007  . Sleep apnea     PT DOES NOT USE CPAP    Past Surgical History  Procedure Laterality Date  . 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 Appendectomy    . Tonsillectomy    . Umbilical hernia repair  2010    in [place]  . Ventral hernia repair N/A 08/11/2015    Procedure: LAPAROSCOPIC VENTRAL HERNIA WITH MESH ;  Surgeon: [name], MD;  Location: [facility] ORS;  Service: General;  Laterality: N/A;    Family History  Problem Relation Age of Onset  . Prostate cancer Father   . Colon cancer Father     Social History:  reports that he has never smoked. He has never used smokeless tobacco. He reports that he drinks about 6.0 oz of alcohol per week. He reports that he does not use illicit drugs.  Allergies:  Allergies  Allergen Reactions  . Codeine     Medications reviewed.    ROS  A multipoint review of systems was completed. All pertinent positives and negatives within the history of present illness. The remainder were negative.  BP 123/75 mmHg  Pulse 62  Temp(Src) 98.2 F (36.8 C) (Oral)  Ht 5\' 9"  (1.753 m)  Wt 93.895 kg (207 lb)  BMI 30.55 kg/m2  Physical Exam  Gen.: No acute distress   chest: Clear to sedation Heart: Regular rhythm  abdomen: Soft, appropriately tender to palpation at incision sites, incisions all well approximated with Dermabond in place. No evidence of erythema or drainage.   No results found for this or any previous visit (from the past 48 hour(s)). No results found.  Assessment/Plan:  1. Aftercare following surgery 54 year old male 1 week status post laparoscopic ventral hernia repair. Experiencing expected recovery course. Discussed that the tenderness at the umbilical site should be resolved within 6 weeks. Discussed use of over-the-counter anti-inflammatories for pain control. Also counseled as to the signs and symptoms of infection in that he is return to clinic immediately should they occur. Patient to follow-up as needed.    [name]  08/17/2015,negative

## 2015-08-17 NOTE — Patient Instructions (Signed)
Please give us a call if you have any questions or concerns. 

## 2015-08-18 ENCOUNTER — Encounter: Payer: BLUE CROSS/BLUE SHIELD | Admitting: General Surgery

## 2015-12-23 ENCOUNTER — Ambulatory Visit
Admission: EM | Admit: 2015-12-23 | Discharge: 2015-12-23 | Disposition: A | Discharge status: Home / Self Care | Payer: BLUE CROSS/BLUE SHIELD | Attending: Family Medicine | Admitting: Family Medicine

## 2015-12-23 ENCOUNTER — Encounter: Payer: Self-pay | Admitting: Emergency Medicine

## 2015-12-23 DIAGNOSIS — H6593 Unspecified nonsuppurative otitis media, bilateral: Secondary | ICD-10-CM | POA: Diagnosis not present

## 2015-12-23 DIAGNOSIS — J301 Allergic rhinitis due to pollen: Secondary | ICD-10-CM | POA: Diagnosis not present

## 2015-12-23 DIAGNOSIS — J209 Acute bronchitis, unspecified: Secondary | ICD-10-CM | POA: Diagnosis not present

## 2015-12-23 MED ORDER — PSEUDOEPHEDRINE HCL 30 MG PO TABS: 30.0000 mg | ORAL_TABLET | ORAL | Status: AC | PRN

## 2015-12-23 MED ORDER — CETIRIZINE HCL 10 MG PO CHEW: 10.0000 mg | CHEWABLE_TABLET | Freq: Every day | ORAL | Status: DC

## 2015-12-23 MED ORDER — SALINE SPRAY 0.65 % NA SOLN: 2.0000 | NASAL | Status: DC

## 2015-12-23 MED ORDER — ALBUTEROL SULFATE HFA 108 (90 BASE) MCG/ACT IN AERS: 1.0000 | INHALATION_SPRAY | Freq: Four times a day (QID) | RESPIRATORY_TRACT | Status: DC | PRN

## 2015-12-23 MED ORDER — BENZONATATE 200 MG PO CAPS: 200.0000 mg | ORAL_CAPSULE | Freq: Three times a day (TID) | ORAL | Status: DC | PRN

## 2015-12-23 NOTE — ED Notes (Signed)
Patient c/o cough and chest congestion for 2 weeks.   

## 2015-12-23 NOTE — Discharge Instructions (Signed)
Acute Bronchitis Bronchitis is inflammation of the airways that extend from the windpipe into the lungs (bronchi). The inflammation often causes mucus to develop. This leads to a cough, which is the most common symptom of bronchitis.  In acute bronchitis, the condition usually develops suddenly and goes away over time, usually in a couple weeks. Smoking, allergies, and asthma can make bronchitis worse. Repeated episodes of bronchitis may cause further lung problems.  CAUSES Acute bronchitis is most often caused by the same virus that causes a cold. The virus can spread from person to person (contagious) through coughing, sneezing, and touching contaminated objects. SIGNS AND SYMPTOMS   Cough.   Fever.   Coughing up mucus.   Body aches.   Chest congestion.   Chills.   Shortness of breath.   Sore throat.  DIAGNOSIS  Acute bronchitis is usually diagnosed through a physical exam. Your health care provider will also ask you questions about your medical history. Tests, such as chest X-rays, are sometimes done to rule out other conditions.  TREATMENT  Acute bronchitis usually goes away in a couple weeks. Oftentimes, no medical treatment is necessary. Medicines are sometimes given for relief of fever or cough. Antibiotic medicines are usually not needed but may be prescribed in certain situations. In some cases, an inhaler may be recommended to help reduce shortness of breath and control the cough. A cool mist vaporizer may also be used to help thin bronchial secretions and make it easier to clear the chest.  HOME CARE INSTRUCTIONS  Get plenty of rest.   Drink enough fluids to keep your urine clear or pale yellow (unless you have a medical condition that requires fluid restriction). Increasing fluids may help thin your respiratory secretions (sputum) and reduce chest congestion, and it will prevent dehydration.   Take medicines only as directed by your health care provider.  If  you were prescribed an antibiotic medicine, finish it all even if you start to feel better.  Avoid smoking and secondhand smoke. Exposure to cigarette smoke or irritating chemicals will make bronchitis worse. If you are a smoker, consider using nicotine gum or skin patches to help control withdrawal symptoms. Quitting smoking will help your lungs heal faster.   Reduce the chances of another bout of acute bronchitis by washing your hands frequently, avoiding people with cold symptoms, and trying not to touch your hands to your mouth, nose, or eyes.   Keep all follow-up visits as directed by your health care provider.  SEEK MEDICAL CARE IF: Your symptoms do not improve after 1 week of treatment.  SEEK IMMEDIATE MEDICAL CARE IF:  You develop an increased fever or chills.   You have chest pain.   You have severe shortness of breath.  You have bloody sputum.   You develop dehydration.  You faint or repeatedly feel like you are going to pass out.  You develop repeated vomiting.  You develop a severe headache. MAKE SURE YOU:   Understand these instructions.  Will watch your condition.  Will get help right away if you are not doing well or get worse.   This information is not intended to replace advice given to you by your health care provider. Make sure you discuss any questions you have with your health care provider.   Document Released: 11/07/2004 Document Revised: 10/21/2014 Document Reviewed: 03/23/2013 Elsevier Interactive Patient Education [employer].  Allergies An allergy is an abnormal reaction to a substance by the body's defense system (immune system). Allergies  can develop at any age. WHAT CAUSES ALLERGIES? An allergic reaction happens when the immune system mistakenly reacts to a normally harmless substance, called an allergen, as if it were harmful. The immune system releases antibodies to fight the substance. Antibodies eventually release a chemical  called histamine into the bloodstream. The release of histamine is meant to protect the body from infection, but it also causes discomfort. An allergic reaction can be triggered by:  Eating an allergen.  Inhaling an allergen.  Touching an allergen. WHAT TYPES OF ALLERGIES ARE THERE? There are many types of allergies. Common types include:  Seasonal allergies. People with this type of allergy are usually allergic to substances that are only present during certain seasons, such as molds and pollens.  Food allergies.  Drug allergies.  Insect allergies.  Animal dander allergies. WHAT ARE SYMPTOMS OF ALLERGIES? Possible allergy symptoms include:  Swelling of the lips, face, tongue, mouth, or throat.  Sneezing, coughing, or wheezing.  Nasal congestion.  Tingling in the mouth.  Rash.  Itching.  Itchy, red, swollen areas of skin (hives).  Watery eyes.  Vomiting.  Diarrhea.  Dizziness.  Lightheadedness.  Fainting.  Trouble breathing or swallowing.  Chest tightness.  Rapid heartbeat. HOW ARE ALLERGIES DIAGNOSED? Allergies are diagnosed with a medical and family history and one or more of the following:  Skin tests.  Blood tests.  A food diary. A food diary is a record of all the foods and drinks you have in a day and of all the symptoms you experience.  The results of an elimination diet. An elimination diet involves eliminating foods from your diet and then adding them back in one by one to find out if a certain food causes an allergic reaction. HOW ARE ALLERGIES TREATED? There is no cure for allergies, but allergic reactions can be treated with medicine. Severe reactions usually need to be treated at a hospital. HOW CAN REACTIONS BE PREVENTED? The best way to prevent an allergic reaction is by avoiding the substance you are allergic to. Allergy shots and medicines can also help prevent reactions in some cases. People with severe allergic reactions may be  able to prevent a life-threatening reaction called anaphylaxis with a medicine given right after exposure to the allergen.   This information is not intended to replace advice given to you by your health care provider. Make sure you discuss any questions you have with your health care provider.   Document Released: 12/24/2002 Document Revised: 10/21/2014 Document Reviewed: 07/12/2014 Elsevier Interactive Patient Education 2016 [place]. Otitis Media With Effusion Otitis media with effusion is the presence of fluid in the middle ear. This is a common problem in children, which often follows ear infections. It may be present for weeks or longer after the infection. Unlike an acute ear infection, otitis media with effusion refers only to fluid behind the ear drum and not infection. Children with repeated ear and sinus infections and allergy problems are the most likely to get otitis media with effusion. CAUSES  The most frequent cause of the fluid buildup is dysfunction of the eustachian tubes. These are the tubes that drain fluid in the ears to the back of the nose (nasopharynx). SYMPTOMS   The main symptom of this condition is hearing loss. As a result, you or your child may:  Listen to the TV at a loud volume.  Not respond to questions.  Ask "what" often when spoken to.  Mistake or confuse one sound or word for another.  There may be a sensation of fullness or pressure but usually not pain. DIAGNOSIS   Your health care provider will diagnose this condition by examining you or your child's ears.  Your health care provider may test the pressure in you or your child's ear with a tympanometer.  A hearing test may be conducted if the problem persists. TREATMENT   Treatment depends on the duration and the effects of the effusion.  Antibiotics, decongestants, nose drops, and cortisone-type drugs (tablets or nasal spray) may not be helpful.  Children with persistent ear effusions may  have delayed language or behavioral problems. Children at risk for developmental delays in hearing, learning, and speech may require referral to a specialist earlier than children not at risk.  You or your child's health care provider may suggest a referral to an ear, nose, and throat surgeon for treatment. The following may help restore normal hearing:  Drainage of fluid.  Placement of ear tubes (tympanostomy tubes).  Removal of adenoids (adenoidectomy). HOME CARE INSTRUCTIONS   Avoid secondhand smoke.  Infants who are breastfed are less likely to have this condition.  Avoid feeding infants while they are lying flat.  Avoid known environmental allergens.  Avoid people who are sick. SEEK MEDICAL CARE IF:   Hearing is not better in 3 months.  Hearing is worse.  Ear pain.  Drainage from the ear.  Dizziness. MAKE SURE YOU:   Understand these instructions.  Will watch your condition.  Will get help right away if you are not doing well or get worse.   This information is not intended to replace advice given to you by your health care provider. Make sure you discuss any questions you have with your health care provider.   Document Released: 11/07/2004 Document Revised: 10/21/2014 Document Reviewed: 04/27/2013 Elsevier Interactive Patient Education [employer].

## 2015-12-23 NOTE — ED Provider Notes (Signed)
CSN: [id number]     Arrival date & time 12/23/15  0807 History   First MD Initiated Contact with Patient 12/23/15 [phone]     Chief Complaint  Patient presents with  . 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  Started out with fever, chills, nonproductive cough that was clear/yellow now green x 3 days.  Has tried motrin, flonase, claritin, nyquil without full relief of symptoms.  History seasonal allergies typically spring uses flonase year round.  FHx: prostate cancer    The history is provided by the patient.    Past Medical History  Diagnosis Date  . Hypertension   . Ventral hernia   . Complication of anesthesia   . MRSA infection 2007  . Sleep apnea     PT DOES NOT USE CPAP   Past Surgical History  Procedure Laterality Date  . 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 Appendectomy    . Tonsillectomy    . Umbilical hernia repair  2010    in [place]  . Ventral hernia repair N/A 08/11/2015    Procedure: LAPAROSCOPIC VENTRAL HERNIA WITH MESH ;  Surgeon: [name], MD;  Location: [facility] ORS;  Service: General;  Laterality: N/A;   Family History  Problem Relation Age of Onset  . Prostate cancer Father   . Colon cancer Father    Social History  Substance Use Topics  . Smoking status: Never Smoker   . Smokeless tobacco: Never Used  . Alcohol Use: 6.0 oz/week    10 Standard drinks or equivalent per week     Comment: OCC-1-2 DRINKS/WEEK    Review of Systems  Constitutional: Positive for fever and chills. Negative for diaphoresis, activity change, appetite change, fatigue and unexpected weight change.  HENT: Positive for congestion, postnasal drip and sinus pressure. Negative for dental problem, drooling, ear discharge, ear pain, facial swelling, hearing loss, mouth sores, nosebleeds,  rhinorrhea, sneezing, sore throat, tinnitus, trouble swallowing and voice change.   Eyes: Negative for photophobia, pain, discharge, redness, itching and visual disturbance.  Respiratory: Positive for cough and wheezing. Negative for choking, chest tightness, shortness of breath and stridor.   Cardiovascular: Negative for chest pain, palpitations and leg swelling.  Gastrointestinal: Negative for nausea, vomiting, abdominal pain, diarrhea, constipation, blood in stool and abdominal distention.  Endocrine: Negative for cold intolerance and heat intolerance.  Genitourinary: Negative for dysuria.  Musculoskeletal: Negative for myalgias, back pain, joint swelling, arthralgias, gait problem, neck pain and neck stiffness.  Skin: Negative for color change, pallor, rash and wound.  Allergic/Immunologic: Positive for environmental allergies. Negative for food allergies and immunocompromised state.  Neurological: Negative for dizziness, tremors, seizures, syncope, facial asymmetry, speech difficulty, weakness, light-headedness, numbness and headaches.  Hematological: Negative for adenopathy. Does not bruise/bleed easily.  Psychiatric/Behavioral: Negative for behavioral problems, confusion, sleep disturbance and agitation.    Allergies  Codeine  Home Medications   Prior to Admission medications   Medication Sig Start Date End Date Taking? Authorizing Provider  albuterol (PROVENTIL HFA;VENTOLIN HFA) 108 (90 Base) MCG/ACT inhaler Inhale 1-2 puffs into the lungs every 6 (six) hours as needed for wheezing or shortness of breath. 12/23/15   Olen Cordial, NP  benzonatate (TESSALON) 200 MG capsule Take 1 capsule (200 mg total) by mouth 3 (three) times daily as needed for cough. 12/23/15   Olen Cordial, NP  cetirizine (ZYRTEC) 10 MG chewable tablet Chew 1 tablet (10 mg total) by mouth daily. 12/23/15   Olen Cordial, NP  docusate sodium (COLACE) 100 MG capsule Take 1 capsule (100 mg total) by mouth 2  (two) times daily. 08/11/15   [name], MD  fluticasone (FLONASE) 50 MCG/ACT nasal spray Place 2 sprays into the nose daily. 11/05/13   Historical Provider, MD  lisinopril (PRINIVIL,ZESTRIL) 10 MG tablet Take 1 tablet by mouth every morning.  02/23/15   Historical Provider, MD  Probiotic Product (PROBIOTIC PO) Take 1 capsule by mouth daily.    Historical Provider, MD  pseudoephedrine (SUDAFED) 30 MG tablet Take 1 tablet (30 mg total) by mouth every 4 (four) hours as needed for congestion (max 8 tabs per 24 hours 240mg ). 12/23/15 12/29/15  Olen Cordial, NP  sodium chloride (OCEAN) 0.65 % SOLN nasal spray Place 2 sprays into both nostrils every 2 (two) hours while awake. 12/23/15 01/13/16  Olen Cordial, NP  tamsulosin (FLOMAX) 0.4 MG CAPS capsule Take 0.4 mg by mouth daily as needed.    Historical Provider, MD   Meds Ordered and Administered this Visit  Medications - No data to display  BP 127/87 mmHg  Pulse 60  Temp(Src) 97 F (36.1 C) (Tympanic)  Resp 16  Ht 5\' 9"  (1.753 m)  Wt 205 lb (92.987 kg)  BMI 30.26 kg/m2  SpO2 100% No data found.   Physical Exam  Constitutional: He is oriented to person, place, and time. Vital signs are normal. He appears well-developed and well-nourished. He is active and cooperative.  Non-toxic appearance. He does not have a sickly appearance. He does not appear ill. No distress.  HENT:  Head: Normocephalic and atraumatic.  Right Ear: External ear and ear canal normal. A middle ear effusion is present.  Left Ear: Hearing, external ear and ear canal normal. A middle ear effusion is present.  Nose: Mucosal edema and rhinorrhea present. No nose lacerations, sinus tenderness, nasal deformity, septal deviation or nasal septal hematoma. No epistaxis.  No foreign bodies. Right sinus exhibits no maxillary sinus tenderness and no frontal sinus tenderness. Left sinus exhibits no maxillary sinus tenderness and no frontal sinus tenderness.  Mouth/Throat: Uvula  is midline and mucous membranes are normal. Mucous membranes are not pale, not dry and not cyanotic. He does not have dentures. No oral lesions. No trismus in the jaw. Normal dentition. No dental abscesses, uvula swelling, lacerations or dental caries. Posterior oropharyngeal edema and posterior oropharyngeal erythema present. No oropharyngeal exudate or tonsillar abscesses.  Cobblestoning posterior pharynx; bilateral TMs with air fluid level mild opacity and vasculature inflamed TM; bilateral nasal turbinates with edema/erythema/clear discharge  Eyes: Conjunctivae, EOM and lids are normal. Pupils are equal, round, and reactive to light. Right eye exhibits no chemosis, no discharge, no exudate and no hordeolum. No foreign body present in the right eye. Left eye exhibits no chemosis, no discharge, no exudate and no hordeolum. No foreign body present in the left eye. Right conjunctiva is not injected. Right conjunctiva has no hemorrhage. Left conjunctiva is not injected. Left conjunctiva has no hemorrhage. No scleral icterus. Right eye exhibits normal extraocular motion and no nystagmus. Left eye exhibits normal extraocular motion and no nystagmus. Right pupil is round and reactive. Left pupil is round and reactive. Pupils are equal.  Neck: Trachea normal and normal range of motion. Neck supple. No tracheal tenderness, no spinous process tenderness and no muscular tenderness present. No rigidity. No tracheal deviation, no edema,  no erythema and normal range of motion present. No thyroid mass and no thyromegaly present.  Cardiovascular: Normal rate, regular rhythm, S1 normal, S2 normal, normal heart sounds and intact distal pulses.  PMI is not displaced.  Exam reveals no gallop and no friction rub.   No murmur heard. Pulmonary/Chest: Effort normal and breath sounds normal. No stridor. No respiratory distress. He has no decreased breath sounds. He has no wheezes. He has no rhonchi. He has no rales.  Negative  egophany all fields  Abdominal: Soft. He exhibits no distension.  Musculoskeletal: Normal range of motion. He exhibits no edema or tenderness.  Lymphadenopathy:       Head (right side): No submental, no submandibular, no tonsillar, no preauricular, no posterior auricular and no occipital adenopathy present.       Head (left side): No submental, no submandibular, no tonsillar, no preauricular, no posterior auricular and no occipital adenopathy present.    He has no cervical adenopathy.       Right cervical: No superficial cervical, no deep cervical and no posterior cervical adenopathy present.      Left cervical: No superficial cervical, no deep cervical and no posterior cervical adenopathy present.  Neurological: He is alert and oriented to person, place, and time. He displays no atrophy and no tremor. No cranial nerve deficit or sensory deficit. He exhibits normal muscle tone. He displays no seizure activity. Coordination and gait normal. GCS eye subscore is 4. GCS verbal subscore is 5. GCS motor subscore is 6.  Skin: Skin is warm, dry and intact. No abrasion, no bruising, no burn, no ecchymosis, no laceration, no lesion, no petechiae and no rash noted. He is not diaphoretic. No cyanosis or erythema. No pallor. Nails show no clubbing.  Psychiatric: He has a normal mood and affect. His speech is normal and behavior is normal. Judgment and thought content normal. Cognition and memory are normal.  Nursing note and vitals reviewed.   ED Course  Procedures (including critical care time)  Labs Review Labs Reviewed - No data to display  Imaging Review No results found.     MDM   1. Acute bronchitis, unspecified organism   2. Otitis media with effusion, bilateral   3. Allergic rhinitis due to pollen    Supportive treatment.   No evidence of invasive bacterial infection, non toxic and well hydrated.  This is most likely self limiting viral infection.  I do not see where any further testing  or imaging is necessary at this time.   I will suggest supportive care, rest, good hygiene and encourage the patient to take adequate fluids.  The patient is to return to clinic or EMERGENCY ROOM if symptoms worsen or change significantly e.g. ear pain, fever, purulent discharge from ears or bleeding.  Exitcare handout on otitis media with effusion given to patient.  Patient verbalized agreement and understanding of treatment plan.    Suspect Viral illness and seasonal allergies: no evidence of invasive bacterial infection, non toxic and well hydrated.  This is most likely self limiting viral infection.  I do not see where any further testing or imaging is necessary at this time.   I will suggest supportive care, rest, good hygiene and encourage the patient to take adequate fluids.  Does not require work excuse.  Sudafed 30mg  po q4-6h prn; flonase 1 spray each nostril BID prn, nasal saline 1-2 sprays each nostril prn q2h, motrin 800mg  po TID prn.  Discussed honey with lemon and salt water gargles  for comfort also.  The patient is to return to clinic or EMERGENCY ROOM if symptoms worsen or change significantly e.g. fever, lethargy, SOB, wheezing.  Exitcare handout on viral illness given to patient.  Patient verbalized agreement and understanding of treatment plan.    Albuterol MDI 1-2 puffs po q4-6h prn protracted cough/wheezing.  Tessalon pearles 200mg  po TID prn cough.  Bronchitis simple, community acquired, may have started as viral (probably respiratory syncytial, parainfluenza, influenza, or adenovirus), but now evidence of acute purulent bronchitis with resultant bronchial edema and mucus formation.  Viruses are the most common cause of bronchial inflammation in otherwise healthy adults with acute bronchitis.  The appearance of sputum is not predictive of whether a bacterial infection is present.  Purulent sputum is most often caused by viral infections.  There are a small portion of those caused by  non-viral agents being Mycoplamsa pneumonia.  Microscopic examination or C&S of sputum in the healthy adult with acute bronchitis is generally not helpful (usually negative or normal respiratory flora) other considerations being cough from upper respiratory tract infections, sinusitis or allergic syndromes (mild asthma or viral pneumonia).  Differential Diagnosis:  reactive airway disease (asthma, allergic aspergillosis (eosinophilia), chronic bronchitis, respiratory infection (Sinusitis, Common cold, pneumonia), congestive heart failure, reflux esophagitis, bronchogenic tumor, aspiration syndromes and/or exposure irritants/tobacco smoke.  In this case, there is no evidence of any invasive bacterial illness.  Most likely viral etiology so will hold on antibiotic treatment.  Advise supportive care with rest, encourage fluids, good hygiene and watch for any worsening symptoms.  If they were to develop:  come back to the office or go to the emergency room if after hours. Without high fever, severe dyspnea, lack of physical findings or other risk factors, I will hold on a chest radiograph and CBC at this time.  I discussed that approximately 50% of patients with acute bronchitis have a cough that lasts up to three weeks, and 25% for over a month.  Tylenol, one to two tablets every four hours as needed for fever or myalgias.   No aspirin.  Patient instructed to follow up in one week or sooner if symptoms worsen. Patient verbalized agreement and understanding of treatment plan.  P2:  hand washing and cover cough  Patient may use normal saline nasal spray as needed.  Consider trial zyrtec 10mg  po daily and continue flonase 2 sprays each nostril daily, nasal saline 2 sprays each nostril q2h prn congestion.  Avoid triggers if possible.  Shower prior to bedtime if exposed to triggers.  If allergic dust/dust mites recommend mattress/pillow covers/encasements; washing linens, vacuuming, sweeping, dusting weekly.  Call or  return to clinic as needed if these symptoms worsen or fail to improve as anticipated.   Exitcare handout on allergic rhinitis given to patient.  Patient verbalized understanding of instructions, agreed with plan of care and had no further questions at this time.  P2:  Avoidance and hand washing.    Olen Cordial, NP 12/23/15 [phone]

## 2015-12-28 ENCOUNTER — Ambulatory Visit (INDEPENDENT_AMBULATORY_CARE_PROVIDER_SITE_OTHER): Payer: BLUE CROSS/BLUE SHIELD | Admitting: Family Medicine

## 2015-12-28 ENCOUNTER — Encounter: Payer: Self-pay | Admitting: Family Medicine

## 2015-12-28 VITALS — BP 120/70 | HR 80 | Temp 97.6°F | Resp 12 | Ht 69.0 in | Wt 218.0 lb

## 2015-12-28 DIAGNOSIS — J0100 Acute maxillary sinusitis, unspecified: Secondary | ICD-10-CM | POA: Diagnosis not present

## 2015-12-28 MED ORDER — AMOXICILLIN-POT CLAVULANATE 875-125 MG PO TABS: 1.0000 | ORAL_TABLET | Freq: Two times a day (BID) | ORAL | Status: DC

## 2015-12-28 NOTE — Progress Notes (Signed)
Name: Isaac Banks   MRN: [id number]    DOB: [birth year]   Date:12/28/2015       Progress Note  Subjective  Chief Complaint  Chief Complaint  Patient presents with  . Bronchitis    follow up urgent care- Dx Bronchitis- gave him tessalon perles- not helping. Didn't receive an antibiotic- coughing up yellow/ green production. Bloody production occassionally    Cough This is a new problem. The current episode started in the past 7 days. The problem has been waxing and waning. The cough is productive of purulent sputum (green). Associated symptoms include ear congestion, a fever, nasal congestion, postnasal drip, rhinorrhea and wheezing. Pertinent negatives include no chest pain, chills, ear pain, headaches, heartburn, hemoptysis, myalgias, rash, sore throat, shortness of breath or weight loss. Associated symptoms comments: Blood tinged nasal discharge. The symptoms are aggravated by cold air. The treatment provided mild relief. There is no history of environmental allergies.    No problem-specific assessment & plan notes found for this encounter.   Past Medical History  Diagnosis Date  . Hypertension   . Ventral hernia   . Complication of anesthesia   . MRSA infection 2007  . Sleep apnea     PT DOES NOT USE CPAP    Past Surgical History  Procedure Laterality Date  . 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 Appendectomy    . Tonsillectomy    . Umbilical hernia repair  2010    in [place]  . Ventral hernia repair N/A 08/11/2015    Procedure: LAPAROSCOPIC VENTRAL HERNIA WITH MESH ;  Surgeon: [name], MD;  Location: [facility] ORS;  Service: General;  Laterality: N/A;    Family History  Problem Relation Age of Onset  . Prostate cancer Father   . Colon cancer Father     Social History   Social History  . Marital Status: Single    Spouse Name: N/A  . Number of Children: N/A  . Years of Education: N/A   Occupational History  . Not  on file.   Social History Main Topics  . Smoking status: Never Smoker   . Smokeless tobacco: Never Used  . Alcohol Use: 6.0 oz/week    10 Standard drinks or equivalent per week     Comment: OCC-1-2 DRINKS/WEEK  . Drug Use: No  . Sexual Activity: Not on file   Other Topics Concern  . Not on file   Social History Narrative    Allergies  Allergen Reactions  . Codeine      Review of Systems  Constitutional: Positive for fever. Negative for chills, weight loss and malaise/fatigue.  HENT: Positive for postnasal drip and rhinorrhea. Negative for ear discharge, ear pain and sore throat.   Eyes: Negative for blurred vision.  Respiratory: Positive for cough and wheezing. Negative for hemoptysis, sputum production and shortness of breath.   Cardiovascular: Negative for chest pain, palpitations and leg swelling.  Gastrointestinal: Negative for heartburn, nausea, abdominal pain, diarrhea, constipation, blood in stool and melena.  Genitourinary: Negative for dysuria, urgency, frequency and hematuria.  Musculoskeletal: Negative for myalgias, back pain, joint pain and neck pain.  Skin: Negative for rash.  Neurological: Negative for dizziness, tingling, sensory change, focal weakness and headaches.  Endo/Heme/Allergies: Negative for environmental allergies and polydipsia. Does not bruise/bleed easily.  Psychiatric/Behavioral: Negative for depression and suicidal ideas. The patient is not nervous/anxious and does not have insomnia.  Objective  Filed Vitals:   12/28/15 1511  BP: 120/70  Pulse: 80  Temp: 97.6 F (36.4 C)  TempSrc: Oral  Resp: 12  Height: 5\' 9"  (1.753 m)  Weight: 218 lb (98.884 kg)  SpO2: 98%    Physical Exam  Constitutional: He is oriented to person, place, and time and well-developed, well-nourished, and in no distress.  HENT:  Head: Normocephalic.  Right Ear: External ear normal.  Left Ear: External ear normal.  Nose: Nose normal.  Mouth/Throat:  Oropharynx is clear and moist.  Eyes: Conjunctivae and EOM are normal. Pupils are equal, round, and reactive to light. Right eye exhibits no discharge. Left eye exhibits no discharge. No scleral icterus.  Neck: Normal range of motion. Neck supple. No JVD present. No tracheal deviation present. No thyromegaly present.  Cardiovascular: Normal rate, regular rhythm, normal heart sounds and intact distal pulses.  Exam reveals no gallop and no friction rub.   No murmur heard. Pulmonary/Chest: Breath sounds normal. No respiratory distress. He has no wheezes. He has no rales.  Abdominal: Soft. Bowel sounds are normal. He exhibits no mass. There is no hepatosplenomegaly. There is no tenderness. There is no rebound, no guarding and no CVA tenderness.  Musculoskeletal: Normal range of motion. He exhibits no edema or tenderness.  Lymphadenopathy:    He has no cervical adenopathy.  Neurological: He is alert and oriented to person, place, and time. He has normal sensation, normal strength, normal reflexes and intact cranial nerves. No cranial nerve deficit.  Skin: Skin is warm. No rash noted.  Psychiatric: Mood and affect normal.  Nursing note and vitals reviewed.     Assessment & Plan  Problem List Items Addressed This Visit    None    Visit Diagnoses    Acute maxillary sinusitis, recurrence not specified    -  Primary    guafenicin    Relevant Medications    amoxicillin-clavulanate (AUGMENTIN) 875-125 MG tablet         Dr. Mac[facility]  12/28/2015

## 2016-04-08 ENCOUNTER — Other Ambulatory Visit: Payer: Self-pay | Admitting: Internal Medicine

## 2016-04-10 ENCOUNTER — Encounter: Payer: Self-pay | Admitting: General Surgery

## 2016-04-10 NOTE — Telephone Encounter (Signed)
pts coming in on 10/26 for cpe, pt also said he was out of his bp med, was told to call the pharm and have them fax a medication refill request.

## 2016-04-19 ENCOUNTER — Ambulatory Visit (INDEPENDENT_AMBULATORY_CARE_PROVIDER_SITE_OTHER): Payer: BLUE CROSS/BLUE SHIELD | Admitting: Internal Medicine

## 2016-04-19 ENCOUNTER — Encounter: Payer: Self-pay | Admitting: Internal Medicine

## 2016-04-19 VITALS — BP 118/82 | HR 69 | Temp 98.2°F | Resp 16 | Ht 69.0 in | Wt 218.0 lb

## 2016-04-19 DIAGNOSIS — R1011 Right upper quadrant pain: Secondary | ICD-10-CM | POA: Diagnosis not present

## 2016-04-19 DIAGNOSIS — R21 Rash and other nonspecific skin eruption: Secondary | ICD-10-CM

## 2016-04-19 DIAGNOSIS — I10 Essential (primary) hypertension: Secondary | ICD-10-CM | POA: Diagnosis not present

## 2016-04-19 NOTE — Progress Notes (Signed)
Date:  04/19/2016   Name:  Isaac Banks   DOB:  05-21-1961   MRN:  [id number]   Chief Complaint: Blister and Flank Pain Flank Pain This is a new problem. The current episode started in the past 7 days (lasted 2 days). The quality of the pain is described as aching. The pain does not radiate. The pain is mild. Associated symptoms include abdominal pain. Pertinent negatives include no chest pain, fever, pelvic pain, weakness or weight loss. He has tried nothing for the symptoms.  Rash This is a new problem. The current episode started in the past 7 days. The problem has been gradually improving since onset. Location: penis. The rash is characterized by blistering (cluster of tiny bumps on dorsal shaft). He was exposed to nothing. Pertinent negatives include no cough, fever or shortness of breath. Past treatments include nothing.  No STD exposure.  No pain, itching or drainage.  HTN -  Controlled on medication.  He is trying to improve his diet and exercise but is unable to lose weight.  He denies depression or fatigue.  He is concerned he may have low testosterone.   Review of Systems  Constitutional: Negative for fever, chills, weight loss, diaphoresis and unexpected weight change.  Respiratory: Negative for cough, choking and shortness of breath.   Cardiovascular: Negative for chest pain, palpitations and leg swelling.  Gastrointestinal: Positive for abdominal pain.  Genitourinary: Positive for flank pain and genital sores. Negative for urgency, discharge, scrotal swelling, penile pain, testicular pain and pelvic pain.  Skin: Positive for rash.  Neurological: Negative for weakness.    Patient Active Problem List   Diagnosis Date Noted  . Aftercare following surgery 08/17/2015  . Hernia of abdominal cavity   . Cardiomegaly 08/10/2015  . Ventral hernia without obstruction or gangrene 07/21/2015  . Benign prostatic hyperplasia with urinary obstruction 02/26/2015  . Essential (primary)  hypertension 02/26/2015  . Family history of colon cancer 02/26/2015  . Neoplasm of uncertain behavior of skin 02/26/2015  . Acute cystitis 06/09/2014    Prior to Admission medications   Medication Sig Start Date End Date Taking? Authorizing Provider  albuterol (PROVENTIL HFA;VENTOLIN HFA) 108 (90 Base) MCG/ACT inhaler Inhale 1-2 puffs into the lungs every 6 (six) hours as needed for wheezing or shortness of breath. 12/23/15  Yes Olen Cordial, NP  lisinopril (PRINIVIL,ZESTRIL) 10 MG tablet TAKE ONE TABLET BY MOUTH ONCE DAILY 04/08/16  Yes Glean Hess, MD  Probiotic Product (PROBIOTIC PO) Take 1 capsule by mouth daily.   Yes Historical Provider, MD  sodium chloride (OCEAN) 0.65 % SOLN nasal spray Place 2 sprays into both nostrils every 2 (two) hours while awake. 12/23/15 01/13/16  Olen Cordial, NP  tamsulosin (FLOMAX) 0.4 MG CAPS capsule Take 0.4 mg by mouth daily as needed. Reported on 04/19/2016    Historical Provider, MD    Allergies  Allergen Reactions  . Codeine     Past Surgical History  Procedure Laterality Date  . 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 Appendectomy    . Tonsillectomy    . Umbilical hernia repair  2010    in [place]  . Ventral hernia repair N/A 08/11/2015    Procedure: LAPAROSCOPIC VENTRAL HERNIA WITH MESH ;  Surgeon: [name], MD;  Location: [facility] ORS;  Service: General;  Laterality: N/A;    Social History  Substance Use Topics  . Smoking status:  Never Smoker   . Smokeless tobacco: Never Used  . Alcohol Use: 6.0 oz/week    10 Standard drinks or equivalent per week     Comment: OCC-1-2 DRINKS/WEEK    Medication list has been reviewed and updated.   Physical Exam  Constitutional: He is oriented to person, place, and time. He appears well-developed. No distress.  HENT:  Head: Normocephalic and atraumatic.  Cardiovascular: Normal rate, regular rhythm and normal heart sounds.   Pulmonary/Chest:  Effort normal and breath sounds normal. No respiratory distress. He has no wheezes.  Abdominal: Soft. Normal appearance and bowel sounds are normal. There is no hepatosplenomegaly. There is no tenderness. There is no rebound and no CVA tenderness. Hernia confirmed negative in the right inguinal area and confirmed negative in the left inguinal area.  Genitourinary: Testes normal.    Right testis shows no mass and no tenderness. Left testis shows no mass and no tenderness. Uncircumcised.  Musculoskeletal: Normal range of motion.  Neurological: He is alert and oriented to person, place, and time.  Skin: Skin is warm and dry. No rash noted.  Psychiatric: He has a normal mood and affect. His behavior is normal. Thought content normal.  Nursing note and vitals reviewed.   BP 118/82 mmHg  Pulse 69  Temp(Src) 98.2 F (36.8 C) (Oral)  Resp 16  Ht 5\' 9"  (1.753 m)  Wt 218 lb (98.884 kg)  BMI 32.18 kg/m2  SpO2 98%  Assessment and Plan: 1. RUQ abdominal pain Suspect gall bladder disease Advise low fat/low protein diet Call if symptoms recur - CBC with Differential/Platelet  2. Essential (primary) hypertension controlled - Comprehensive metabolic panel - TSH - Testosterone  3. Rash and nonspecific skin eruption Appears to be resolving without treatment - may be molluscum contagiosum but lesions not characteristic Return if recurrent   Halina Maidens, MD [facility]  04/19/2016

## 2016-04-20 LAB — TESTOSTERONE: TESTOSTERONE: 236 ng/dL — AB (ref 348–1197)

## 2016-04-20 LAB — COMPREHENSIVE METABOLIC PANEL
A/G RATIO: 2 (ref 1.2–2.2)
ALK PHOS: 58 IU/L (ref 39–117)
ALT: 22 IU/L (ref 0–44)
AST: 16 IU/L (ref 0–40)
Albumin: 4.1 g/dL (ref 3.5–5.5)
BILIRUBIN TOTAL: 0.6 mg/dL (ref 0.0–1.2)
BUN/Creatinine Ratio: 19 (ref 9–20)
BUN: 15 mg/dL (ref 6–24)
CHLORIDE: 103 mmol/L (ref 96–106)
CO2: 22 mmol/L (ref 18–29)
Calcium: 9.1 mg/dL (ref 8.7–10.2)
Creatinine, Ser: 0.8 mg/dL (ref 0.76–1.27)
GFR calc non Af Amer: 103 mL/min/{1.73_m2} (ref >59)
GFR, EST AFRICAN AMERICAN: 119 mL/min/{1.73_m2} (ref >59)
Globulin, Total: 2.1 g/dL (ref 1.5–4.5)
Glucose: 92 mg/dL (ref 65–99)
POTASSIUM: 4.3 mmol/L (ref 3.5–5.2)
Sodium: 142 mmol/L (ref 134–144)
Total Protein: 6.2 g/dL (ref 6.0–8.5)

## 2016-04-20 LAB — CBC WITH DIFFERENTIAL/PLATELET
BASOS ABS: 0.1 10*3/uL (ref 0.0–0.2)
BASOS: 1 %
EOS (ABSOLUTE): 0.4 10*3/uL (ref 0.0–0.4)
Eos: 4 %
Hematocrit: 43.5 % (ref 37.5–51.0)
Hemoglobin: 14.7 g/dL (ref 12.6–17.7)
Immature Grans (Abs): 0 10*3/uL (ref 0.0–0.1)
Immature Granulocytes: 0 %
LYMPHS: 24 %
Lymphocytes Absolute: 2.4 10*3/uL (ref 0.7–3.1)
MCH: 29.7 pg (ref 26.6–33.0)
MCHC: 33.8 g/dL (ref 31.5–35.7)
MCV: 88 fL (ref 79–97)
Monocytes Absolute: 0.9 10*3/uL (ref 0.1–0.9)
Monocytes: 9 %
NEUTROS ABS: 6.4 10*3/uL (ref 1.4–7.0)
Neutrophils: 62 %
PLATELETS: 254 10*3/uL (ref 150–379)
RBC: 4.95 x10E6/uL (ref 4.14–5.80)
RDW: 14 % (ref 12.3–15.4)
WBC: 10.2 10*3/uL (ref 3.4–10.8)

## 2016-04-20 LAB — TSH: TSH: 2.12 u[IU]/mL (ref 0.450–4.500)

## 2016-04-26 ENCOUNTER — Encounter: Payer: Self-pay | Admitting: Internal Medicine

## 2016-04-26 ENCOUNTER — Ambulatory Visit (INDEPENDENT_AMBULATORY_CARE_PROVIDER_SITE_OTHER): Payer: BLUE CROSS/BLUE SHIELD | Admitting: Internal Medicine

## 2016-04-26 VITALS — BP 120/80 | HR 64 | Resp 14 | Wt 215.0 lb

## 2016-04-26 DIAGNOSIS — E291 Testicular hypofunction: Secondary | ICD-10-CM | POA: Diagnosis not present

## 2016-04-26 DIAGNOSIS — Z125 Encounter for screening for malignant neoplasm of prostate: Secondary | ICD-10-CM | POA: Diagnosis not present

## 2016-04-26 DIAGNOSIS — R7989 Other specified abnormal findings of blood chemistry: Secondary | ICD-10-CM

## 2016-04-26 MED ORDER — TESTOSTERONE 20.25 MG/ACT (1.62%) TD GEL: 2.0000 "application " | Freq: Every morning | TRANSDERMAL | Status: DC

## 2016-04-26 NOTE — Progress Notes (Signed)
Date:  04/26/2016   Name:  Isaac Banks   DOB:  1960/12/22   MRN:  [id number]   Chief Complaint: other Patient was seen recently for follow up.  Complained about low energy and inability to lose weight and had a low testosterone on blood work.  He also has some mild ED but no decrease in libido.  That was an afternoon lab - needs to have a morning level drawn to confirm.  He is also informed that he will need PSA monitoring. Genital rash - seen last visit has now resolved.  Lab Results  Component Value Date   TESTOSTERONE 236* 04/19/2016    Review of Systems  Constitutional: Negative for fever, chills, diaphoresis and unexpected weight change.  Respiratory: Negative for cough, choking and shortness of breath.   Cardiovascular: Negative for chest pain, palpitations and leg swelling.  Gastrointestinal: Positive for abdominal pain.  Genitourinary: Positive for flank pain and genital sores. Negative for urgency, discharge, scrotal swelling, penile pain and testicular pain.  Skin: Positive for rash.  Neurological: Negative for weakness.  Psychiatric/Behavioral: Negative for dysphoric mood. The patient is not nervous/anxious.     Patient Active Problem List   Diagnosis Date Noted  . Aftercare following surgery 08/17/2015  . Hernia of abdominal cavity   . Cardiomegaly 08/10/2015  . Ventral hernia without obstruction or gangrene 07/21/2015  . Benign prostatic hyperplasia with urinary obstruction 02/26/2015  . Essential (primary) hypertension 02/26/2015  . Family history of colon cancer 02/26/2015  . Neoplasm of uncertain behavior of skin 02/26/2015  . Acute cystitis 06/09/2014    Prior to Admission medications   Medication Sig Start Date End Date Taking? Authorizing Provider  albuterol (PROVENTIL HFA;VENTOLIN HFA) 108 (90 Base) MCG/ACT inhaler Inhale 1-2 puffs into the lungs every 6 (six) hours as needed for wheezing or shortness of breath. 12/23/15  Yes Olen Cordial, NP    lisinopril (PRINIVIL,ZESTRIL) 10 MG tablet TAKE ONE TABLET BY MOUTH ONCE DAILY 04/08/16  Yes Glean Hess, MD  Probiotic Product (PROBIOTIC PO) Take 1 capsule by mouth daily.   Yes Historical Provider, MD  tamsulosin (FLOMAX) 0.4 MG CAPS capsule Take 0.4 mg by mouth daily as needed. Reported on 04/19/2016   Yes Historical Provider, MD    Allergies  Allergen Reactions  . Codeine     Past Surgical History  Procedure Laterality Date  . 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 Appendectomy    . Tonsillectomy    . Umbilical hernia repair  2010    in [place]  . Ventral hernia repair N/A 08/11/2015    Procedure: LAPAROSCOPIC VENTRAL HERNIA WITH MESH ;  Surgeon: [name], MD;  Location: [facility] ORS;  Service: General;  Laterality: N/A;    Social History  Substance Use Topics  . Smoking status: Never Smoker   . Smokeless tobacco: Never Used  . Alcohol Use: 6.0 oz/week    10 Standard drinks or equivalent per week     Comment: OCC-1-2 DRINKS/WEEK     Medication list has been reviewed and updated.   Physical Exam  Constitutional: He is oriented to person, place, and time. He appears well-developed. No distress.  HENT:  Head: Normocephalic and atraumatic.  Cardiovascular: Normal rate, regular rhythm and normal heart sounds.   Pulmonary/Chest: Effort normal and breath sounds normal. No respiratory distress.  Musculoskeletal: Normal range of motion.  Neurological: He is alert and oriented  to person, place, and time.  Skin: Skin is warm and dry. No rash noted.  Psychiatric: He has a normal mood and affect. His behavior is normal. Thought content normal.  Nursing note and vitals reviewed.   BP 120/80 mmHg  Pulse 64  Resp 14  Wt 215 lb (97.523 kg)  Assessment and Plan: 1. Low serum testosterone Recheck labs including PSA Will need to do PA once pt takes Rx to pharmacy - Testosterone - Testosterone (ANDROGEL PUMP) 20.25 MG/ACT  (1.62%) GEL; Place 2 application onto the skin every morning.  Dispense: 75 g; Refill: 2  2. Prostate cancer screening - PSA   Halina Maidens, MD [facility]  04/26/2016

## 2016-04-27 LAB — TESTOSTERONE: TESTOSTERONE: 282 ng/dL — AB (ref 348–1197)

## 2016-04-27 LAB — PSA: PROSTATE SPECIFIC AG, SERUM: 0.5 ng/mL (ref 0.0–4.0)

## 2016-07-08 ENCOUNTER — Ambulatory Visit (INDEPENDENT_AMBULATORY_CARE_PROVIDER_SITE_OTHER): Payer: BLUE CROSS/BLUE SHIELD | Admitting: Internal Medicine

## 2016-07-08 ENCOUNTER — Encounter: Payer: Self-pay | Admitting: Internal Medicine

## 2016-07-08 VITALS — BP 120/80 | HR 67 | Resp 16 | Ht 69.0 in | Wt 214.0 lb

## 2016-07-08 DIAGNOSIS — J0100 Acute maxillary sinusitis, unspecified: Secondary | ICD-10-CM | POA: Diagnosis not present

## 2016-07-08 DIAGNOSIS — Z8719 Personal history of other diseases of the digestive system: Secondary | ICD-10-CM

## 2016-07-08 DIAGNOSIS — I10 Essential (primary) hypertension: Secondary | ICD-10-CM | POA: Diagnosis not present

## 2016-07-08 DIAGNOSIS — S39012A Strain of muscle, fascia and tendon of lower back, initial encounter: Secondary | ICD-10-CM

## 2016-07-08 MED ORDER — AZITHROMYCIN 250 MG PO TABS: ORAL_TABLET | ORAL | 0 refills | Status: DC

## 2016-07-08 MED ORDER — MELOXICAM 15 MG PO TABS: 15.0000 mg | ORAL_TABLET | Freq: Every day | ORAL | 0 refills | Status: DC

## 2016-07-08 MED ORDER — MECLIZINE HCL 25 MG PO TABS: 25.0000 mg | ORAL_TABLET | Freq: Every day | ORAL | 0 refills | Status: DC

## 2016-07-08 NOTE — Progress Notes (Signed)
Date:  07/08/2016   Name:  Isaac Banks   DOB:  1961/04/16   MRN:  [id number]   Chief Complaint: Abdominal Pain (Ttrouble with digestion and dizziness as well as heart burn and nausea for 2-3 months)  He reports feeling some ear fullness and sinus pressure. In addition he feels off balance almost like mild vertigo. This is also been associated with some nausea but no vomiting. He's had one or 2 episodes of loose stools but nothing persistent. He's had occasional heartburn and feels as if it might be a primary abdominal problem. He denies any abdominal pains.  Back Pain  This is a new problem. The problem occurs intermittently. The pain is present in the gluteal. The pain does not radiate. The patient is experiencing no pain. The pain is worse during the day. The symptoms are aggravated by bending and twisting. Pertinent negatives include no abdominal pain, chest pain or fever. He has tried chiropractic manipulation and NSAIDs for the symptoms. The treatment provided mild relief.   Rectal pain/fissure/bleeding - patient has a history of rectal fissure. [place] He still has intermittent problems with bleeding and pain. He also feels like he has hemorrhoids. Although he denies actual lumps at the rectal area.  Review of Systems  Constitutional: Negative for chills, fatigue and fever.  HENT: Positive for congestion, sinus pressure and tinnitus (chronic).   Respiratory: Negative for chest tightness and shortness of breath.   Cardiovascular: Negative for chest pain, palpitations and leg swelling.  Gastrointestinal: Positive for anal bleeding and nausea. Negative for abdominal distention, abdominal pain and vomiting.  Musculoskeletal: Positive for back pain.  Neurological: Positive for dizziness and light-headedness.  Hematological: Negative for adenopathy.    Patient Active Problem List   Diagnosis Date Noted  . Aftercare following surgery 08/17/2015  .  Hernia of abdominal cavity   . Cardiomegaly 08/10/2015  . Ventral hernia without obstruction or gangrene 07/21/2015  . Benign prostatic hyperplasia with urinary obstruction 02/26/2015  . Essential (primary) hypertension 02/26/2015  . Family history of colon cancer 02/26/2015  . Neoplasm of uncertain behavior of skin 02/26/2015  . Acute cystitis 06/09/2014    Prior to Admission medications   Medication Sig Start Date End Date Taking? Authorizing Provider  lisinopril (PRINIVIL,ZESTRIL) 10 MG tablet TAKE ONE TABLET BY MOUTH ONCE DAILY 04/08/16  Yes Glean Hess, MD  Probiotic Product (PROBIOTIC PO) Take 1 capsule by mouth daily.   Yes Historical Provider, MD  tamsulosin (FLOMAX) 0.4 MG CAPS capsule Take 0.4 mg by mouth daily as needed. Reported on 04/19/2016   Yes Historical Provider, MD    Allergies  Allergen Reactions  . Codeine     Past Surgical History:  Procedure Laterality Date  . APPENDECTOMY    . 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 TONSILLECTOMY    . UMBILICAL HERNIA REPAIR  2010   in [place]  . VENTRAL HERNIA REPAIR N/A 08/11/2015   Procedure: LAPAROSCOPIC VENTRAL HERNIA WITH MESH ;  Surgeon: [name], MD;  Location: [facility] ORS;  Service: General;  Laterality: N/A;    Social History  Substance Use Topics  . Smoking status: Never Smoker  . Smokeless tobacco: Never Used  . Alcohol use 6.0 oz/week    10 Standard drinks or equivalent per week     Comment: OCC-1-2 DRINKS/WEEK     Medication list has been reviewed  and updated.   Physical Exam  Constitutional: He is oriented to person, place, and time. He appears well-developed and well-nourished.  HENT:  Right Ear: External ear and ear canal normal. Tympanic membrane is not erythematous and not retracted.  Left Ear: External ear and ear canal normal. Tympanic membrane is bulging. Tympanic membrane is not erythematous and not retracted.  Nose: Right sinus exhibits  maxillary sinus tenderness. Right sinus exhibits no frontal sinus tenderness. Left sinus exhibits maxillary sinus tenderness. Left sinus exhibits no frontal sinus tenderness.  Mouth/Throat: Uvula is midline and mucous membranes are normal. No oral lesions. Posterior oropharyngeal erythema present. No oropharyngeal exudate.  Cardiovascular: Normal rate, regular rhythm and normal heart sounds.   Pulmonary/Chest: Breath sounds normal. He has no wheezes. He has no rales.  Genitourinary:  Genitourinary Comments: Rectal exam deferred  Musculoskeletal:       Lumbar back: He exhibits no tenderness.       Back:  Lymphadenopathy:    He has no cervical adenopathy.  Neurological: He is alert and oriented to person, place, and time. He has normal strength. No sensory deficit.  Reflex Scores:      Patellar reflexes are 2+ on the right side and 2+ on the left side.      Achilles reflexes are 2+ on the right side and 2+ on the left side.   BP 120/80   Pulse 67   Resp 16   Ht 5\' 9"  (1.753 m)   Wt 214 lb (97.1 kg)   SpO2 97%   BMI 31.60 kg/m   Assessment and Plan: 1. Acute maxillary sinusitis, recurrence not specified Causing inner ear sx and nausea Continue nasal spray Add sudafed bid - azithromycin (ZITHROMAX Z-PAK) 250 MG tablet; 2 tabs on day #1 then 1 tab daily until complete  Dispense: 6 each; Refill: 0 - meclizine (ANTIVERT) 25 MG tablet; Take 1 tablet (25 mg total) by mouth at bedtime.  Dispense: 30 tablet; Refill: 0  2. History of rectal fissure - Ambulatory referral to Gastroenterology  3. Low back strain, initial encounter Begin daily nsaids - meloxicam (MOBIC) 15 MG tablet; Take 1 tablet (15 mg total) by mouth daily.  Dispense: 30 tablet; Refill: 0  4. Essential (primary) hypertension controlled   Halina Maidens, MD [facility]  07/08/2016

## 2016-08-08 ENCOUNTER — Ambulatory Visit (INDEPENDENT_AMBULATORY_CARE_PROVIDER_SITE_OTHER): Payer: BLUE CROSS/BLUE SHIELD | Admitting: Internal Medicine

## 2016-08-08 ENCOUNTER — Encounter: Payer: Self-pay | Admitting: Internal Medicine

## 2016-08-08 VITALS — BP 120/84 | HR 60 | Ht 69.0 in | Wt 213.0 lb

## 2016-08-08 DIAGNOSIS — I10 Essential (primary) hypertension: Secondary | ICD-10-CM | POA: Diagnosis not present

## 2016-08-08 DIAGNOSIS — Z1159 Encounter for screening for other viral diseases: Secondary | ICD-10-CM

## 2016-08-08 DIAGNOSIS — N401 Enlarged prostate with lower urinary tract symptoms: Secondary | ICD-10-CM

## 2016-08-08 DIAGNOSIS — Z23 Encounter for immunization: Secondary | ICD-10-CM

## 2016-08-08 DIAGNOSIS — Z0000 Encounter for general adult medical examination without abnormal findings: Secondary | ICD-10-CM | POA: Diagnosis not present

## 2016-08-08 DIAGNOSIS — N138 Other obstructive and reflux uropathy: Secondary | ICD-10-CM

## 2016-08-08 DIAGNOSIS — M722 Plantar fascial fibromatosis: Secondary | ICD-10-CM | POA: Insufficient documentation

## 2016-08-08 LAB — POCT URINALYSIS DIPSTICK
BILIRUBIN UA: NEGATIVE
GLUCOSE UA: NEGATIVE
KETONES UA: NEGATIVE
Leukocytes, UA: NEGATIVE
Nitrite, UA: NEGATIVE
Protein, UA: NEGATIVE
RBC UA: NEGATIVE
SPEC GRAV UA: 1.02
UROBILINOGEN UA: 0.2
pH, UA: 7

## 2016-08-08 MED ORDER — LISINOPRIL 10 MG PO TABS: 10.0000 mg | ORAL_TABLET | Freq: Every day | ORAL | 3 refills | Status: DC

## 2016-08-08 NOTE — Progress Notes (Signed)
Date:  08/08/2016   Name:  Jermarion Haverty   DOB:  1961/06/24   MRN:  [id number]   Chief Complaint: Annual Exam and Hypertension Lason Heick is a 55 y.o. male who presents today for his Complete Annual Exam. He feels fairly well. He reports exercising none - has an active job. He reports he is sleeping well.   Hypertension  This is a chronic problem. The current episode started more than 1 year ago. The problem is controlled. Pertinent negatives include no chest pain, headaches, palpitations or shortness of breath.   Low Testosterone - earlier this year 282 and 236.  Testosterone prescribed.  PSA 0.5. He decided not to begin therapy.  Heel pain - off and on for months.  Worse recently and seems to be more persistent.  Tender and stiff after sitting a while but then improved.  Has tried some advil but no other treatment.  He thinks it is making him limp and causing low back pain.  He also knows that his right leg is 1/2 inch shorter than the left.  He had a lift but does not use it.  Review of Systems  Constitutional: Negative for appetite change, chills, diaphoresis, fatigue and unexpected weight change.  HENT: Negative for hearing loss, tinnitus, trouble swallowing and voice change.   Eyes: Negative for visual disturbance.  Respiratory: Negative for choking, shortness of breath and wheezing.   Cardiovascular: Negative for chest pain, palpitations and leg swelling.  Gastrointestinal: Negative for abdominal pain, blood in stool, constipation and diarrhea.  Genitourinary: Negative for difficulty urinating, dysuria and frequency.  Musculoskeletal: Positive for arthralgias, back pain and gait problem. Negative for myalgias.  Skin: Negative for color change and rash.  Neurological: Negative for dizziness, syncope and headaches.  Hematological: Negative for adenopathy.  Psychiatric/Behavioral: Negative for dysphoric mood and sleep disturbance.    Patient Active Problem List   Diagnosis  Date Noted  . Cardiomegaly 08/10/2015  . Benign prostatic hyperplasia with urinary obstruction 02/26/2015  . Essential (primary) hypertension 02/26/2015  . Family history of colon cancer 02/26/2015  . Neoplasm of uncertain behavior of skin 02/26/2015    Prior to Admission medications   Medication Sig Start Date End Date Taking? Authorizing Provider  lisinopril (PRINIVIL,ZESTRIL) 10 MG tablet TAKE ONE TABLET BY MOUTH ONCE DAILY 04/08/16   Glean Hess, MD  meclizine (ANTIVERT) 25 MG tablet Take 1 tablet (25 mg total) by mouth at bedtime. 07/08/16   Glean Hess, MD  meloxicam (MOBIC) 15 MG tablet Take 1 tablet (15 mg total) by mouth daily. 07/08/16   Glean Hess, MD  Probiotic Product (PROBIOTIC PO) Take 1 capsule by mouth daily.    Historical Provider, MD  tamsulosin (FLOMAX) 0.4 MG CAPS capsule Take 0.4 mg by mouth daily as needed. Reported on 04/19/2016    Historical Provider, MD    Allergies  Allergen Reactions  . Codeine     Past Surgical History:  Procedure Laterality Date  . APPENDECTOMY    . 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 TONSILLECTOMY    . UMBILICAL HERNIA REPAIR  2010   in [place]  . VENTRAL HERNIA REPAIR N/A 08/11/2015   Procedure: LAPAROSCOPIC VENTRAL HERNIA WITH MESH ;  Surgeon: [name], MD;  Location: [facility] ORS;  Service: General;  Laterality: N/A;    Social History  Substance Use Topics  . Smoking status: Never Smoker  . Smokeless  tobacco: Never Used  . Alcohol use 6.0 oz/week    10 Standard drinks or equivalent per week     Comment: OCC-1-2 DRINKS/WEEK     Medication list has been reviewed and updated.   Physical Exam  Constitutional: He is oriented to person, place, and time. He appears well-developed and well-nourished.  HENT:  Head: Normocephalic.  Right Ear: Tympanic membrane, external ear and ear canal normal.  Left Ear: Tympanic membrane, external ear and ear canal normal.  Nose:  Nose normal.  Mouth/Throat: Uvula is midline and oropharynx is clear and moist.  Eyes: Conjunctivae and EOM are normal. Pupils are equal, round, and reactive to light.  Neck: Normal range of motion. Neck supple. Carotid bruit is not present. No thyromegaly present.  Cardiovascular: Normal rate, regular rhythm, normal heart sounds and intact distal pulses.   Pulmonary/Chest: Effort normal and breath sounds normal. He has no wheezes. Right breast exhibits no mass. Left breast exhibits no mass.  Abdominal: Soft. Normal appearance and bowel sounds are normal. There is no hepatosplenomegaly. There is no tenderness.  Musculoskeletal: Normal range of motion.       Feet:  Lymphadenopathy:    He has no cervical adenopathy.  Neurological: He is alert and oriented to person, place, and time. He has normal reflexes.  Skin: Skin is warm, dry and intact.  Psychiatric: He has a normal mood and affect. His speech is normal and behavior is normal. Judgment and thought content normal.  Nursing note and vitals reviewed.   BP 120/84   Pulse 60   Ht 5\' 9"  (1.753 m)   Wt 213 lb (96.6 kg)   BMI 31.45 kg/m   Assessment and Plan: 1. Annual physical exam Normal exam TDaP and colonoscopy up to date No testosterone therapy for now - Lipid panel  2. Essential (primary) hypertension controlled - lisinopril (PRINIVIL,ZESTRIL) 10 MG tablet; Take 1 tablet (10 mg total) by mouth daily.  Dispense: 90 tablet; Refill: 3 - POCT urinalysis dipstick  3. Benign prostatic hyperplasia with urinary obstruction Mild sx - uses flomax PRN   4. Need for hepatitis C screening test - Hepatitis C antibody  5. Plantar fasciitis, right Continue Advil;  - Ambulatory referral to Podiatry  6. Need for influenza vaccination - Flu Vaccine QUAD 36+ mos IM   Halina Maidens, MD [facility]  08/08/2016

## 2016-08-09 ENCOUNTER — Encounter: Payer: Self-pay | Admitting: Internal Medicine

## 2016-08-09 ENCOUNTER — Other Ambulatory Visit: Payer: Self-pay

## 2016-08-09 DIAGNOSIS — E785 Hyperlipidemia, unspecified: Secondary | ICD-10-CM | POA: Insufficient documentation

## 2016-08-09 LAB — LIPID PANEL
Chol/HDL Ratio: 4.5 ratio units (ref 0.0–5.0)
Cholesterol, Total: 243 mg/dL — ABNORMAL HIGH (ref 100–199)
HDL: 54 mg/dL (ref >39)
LDL Calculated: 152 mg/dL — ABNORMAL HIGH (ref 0–99)
Triglycerides: 186 mg/dL — ABNORMAL HIGH (ref 0–149)
VLDL Cholesterol Cal: 37 mg/dL (ref 5–40)

## 2016-08-09 LAB — HEPATITIS C ANTIBODY

## 2016-08-12 ENCOUNTER — Encounter: Payer: Self-pay | Admitting: Gastroenterology

## 2016-08-12 ENCOUNTER — Ambulatory Visit (INDEPENDENT_AMBULATORY_CARE_PROVIDER_SITE_OTHER): Payer: BLUE CROSS/BLUE SHIELD | Admitting: Gastroenterology

## 2016-08-12 ENCOUNTER — Other Ambulatory Visit: Payer: Self-pay

## 2016-08-12 VITALS — BP 130/80 | HR 70 | Temp 98.3°F | Ht 69.0 in | Wt 219.0 lb

## 2016-08-12 DIAGNOSIS — K921 Melena: Secondary | ICD-10-CM | POA: Diagnosis not present

## 2016-08-12 MED ORDER — PEG 3350-KCL-NA BICARB-NACL 420 G PO SOLR: ORAL | 0 refills | Status: DC

## 2016-08-12 NOTE — Progress Notes (Signed)
Gastroenterology Consultation  Referring Provider:     Glean Hess, MD Primary Care Physician:  Halina Maidens, MD Primary Gastroenterologist:  Dr. Allen Norris     Reason for Consultation:     Hemorrhoidal problems        HPI:   Isaac Banks is a 55 y.o. y/o male referred for consultation & management of Hemorrhoidal problems by Dr. Halina Maidens, MD.  This patient comes in today reporting that his hemorrhoids bother him when he drinks beer. When asking him what the problem is what her from drinking beer he states that he has loose bowel movements caused by his hemorrhoids. The patient also reports that he feels something prolapse when he goes to the bathroom. There is no report of any black stools or bloody stools but the patient has had some intermittent rectal bleeding. The patient also reports that when he had surgery in the past he was put on pain medication and had anal fissures. He is not having any pain in his rectum at the present time. The patient also reports that he has a family history with a father who had colon cancer and prostate cancer.  Past Medical History:  Diagnosis Date  . Complication of anesthesia   . Hypertension   . MRSA infection 2007  . Sleep apnea    PT DOES NOT USE CPAP  . Ventral hernia     Past Surgical History:  Procedure Laterality Date  . APPENDECTOMY    . 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 TONSILLECTOMY    . UMBILICAL HERNIA REPAIR  2010   in [place]  . VENTRAL HERNIA REPAIR N/A 08/11/2015   Procedure: LAPAROSCOPIC VENTRAL HERNIA WITH MESH ;  Surgeon: [name], MD;  Location: [facility] ORS;  Service: General;  Laterality: N/A;    Prior to Admission medications   Medication Sig Start Date End Date Taking? Authorizing Provider  lisinopril (PRINIVIL,ZESTRIL) 10 MG tablet Take 1 tablet (10 mg total) by mouth daily. 08/08/16  Yes Glean Hess, MD  meloxicam [facility]) 15 MG tablet  07/08/16  Yes  Historical Provider, MD  Probiotic Product (PROBIOTIC PO) Take 1 capsule by mouth daily.   Yes Historical Provider, MD  tamsulosin (FLOMAX) 0.4 MG CAPS capsule Take 0.4 mg by mouth daily as needed. Reported on 04/19/2016   Yes Historical Provider, MD  polyethylene glycol-electrolytes (TRILYTE) 420 g solution Drink one 8 oz glass every 20 mins until stools are clear. 08/12/16   Lucilla Lame, MD    Family History  Problem Relation Age of Onset  . Prostate cancer Father   . Colon cancer Father      Social History  Substance Use Topics  . Smoking status: Never Smoker  . Smokeless tobacco: Never Used  . Alcohol use 6.0 oz/week    10 Standard drinks or equivalent per week     Comment: OCC-1-2 DRINKS/WEEK    Allergies as of 08/12/2016 - Review Complete 08/12/2016  Allergen Reaction Noted  . Codeine  02/26/2015    Review of Systems:    All systems reviewed and negative except where noted in HPI.   Physical Exam:  BP 130/80   Pulse 70   Temp 98.3 F (36.8 C) (Oral)   Ht 5\' 9"  (1.753 m)   Wt 219 lb (99.3 kg)   BMI 32.34 kg/m  No LMP for male patient. Psych:  Alert and cooperative. Normal mood and  affect. General:   Alert,  Well-developed, well-nourished, pleasant and cooperative in NAD Head:  Normocephalic and atraumatic. Eyes:  Sclera clear, no icterus.   Conjunctiva pink. Ears:  Normal auditory acuity. Nose:  No deformity, discharge, or lesions. Mouth:  No deformity or lesions,oropharynx pink & moist. Neck:  Supple; no masses or thyromegaly. Lungs:  Respirations even and unlabored.  Clear throughout to auscultation.   No wheezes, crackles, or rhonchi. No acute distress. Heart:  Regular rate and rhythm; no murmurs, clicks, rubs, or gallops. Abdomen:  Normal bowel sounds.  No bruits.  Soft, non-tender and non-distended without masses, hepatosplenomegaly or hernias noted.  No guarding or rebound tenderness.  Negative Carnett sign.   Rectal:  Deferred.  Msk:  Symmetrical without  gross deformities.  Good, equal movement & strength bilaterally. Pulses:  Normal pulses noted. Extremities:  No clubbing or edema.  No cyanosis. Neurologic:  Alert and oriented x3;  grossly normal neurologically. Skin:  Intact without significant lesions or rashes.  No jaundice. Lymph Nodes:  No significant cervical adenopathy. Psych:  Alert and cooperative. Normal mood and affect.  Imaging Studies: No results found.  Assessment and Plan:   Isaac Banks is a 55 y.o. y/o male who comes in today with a report of a history of anal fissures and hemorrhoidal problems at the present time. The patient's main symptom is loose bowel movements when he drinks beer and eats certain foods. He is not having any rectal bleeding at the present time but has had intermittent rectal bleeding with his straining at stools. He also denies any rectal pain at this time. The patient will be set up for colonoscopy due to his family history of colon cancer and his intermittent rectal bleeding. The patient has also been told to increase fiber in his diet that may firm up his stools somewhat so that his loose bowel movements with diarrhea may improve. The patient has been explained the plan and agrees with it.    Note: This dictation was prepared with Dragon dictation along with smaller phrase technology. Any transcriptional errors that result from this process are unintentional.

## 2016-08-13 ENCOUNTER — Telehealth: Payer: Self-pay | Admitting: Internal Medicine

## 2016-08-13 NOTE — Telephone Encounter (Signed)
Patient is returning your phone call. 

## 2016-08-14 NOTE — Telephone Encounter (Signed)
For you -

## 2016-08-15 ENCOUNTER — Telehealth: Payer: Self-pay | Admitting: Internal Medicine

## 2016-08-15 NOTE — Telephone Encounter (Signed)
Called no vm- I mailed these on 10/31

## 2016-08-15 NOTE — Telephone Encounter (Signed)
Patient is calling to get his lab results. Please advise.

## 2016-08-19 ENCOUNTER — Encounter: Payer: Self-pay | Admitting: *Deleted

## 2016-08-23 NOTE — Discharge Instructions (Signed)

## 2016-08-26 ENCOUNTER — Ambulatory Visit (INDEPENDENT_AMBULATORY_CARE_PROVIDER_SITE_OTHER): Payer: BLUE CROSS/BLUE SHIELD | Admitting: Surgery

## 2016-08-26 ENCOUNTER — Ambulatory Visit
Admission: RE | Admit: 2016-08-26 | Discharge: 2016-08-26 | Disposition: A | Discharge status: Home / Self Care | Payer: BLUE CROSS/BLUE SHIELD | Source: Ambulatory Visit | Attending: Gastroenterology | Admitting: Gastroenterology

## 2016-08-26 ENCOUNTER — Encounter
Admission: RE | Disposition: A | Discharge status: Home / Self Care | Payer: Self-pay | Source: Ambulatory Visit | Attending: Gastroenterology

## 2016-08-26 ENCOUNTER — Ambulatory Visit: Payer: BLUE CROSS/BLUE SHIELD | Admitting: Anesthesiology

## 2016-08-26 ENCOUNTER — Encounter: Payer: Self-pay | Admitting: Surgery

## 2016-08-26 VITALS — BP 136/82 | HR 62 | Temp 97.8°F | Ht 69.0 in | Wt 214.0 lb

## 2016-08-26 DIAGNOSIS — D128 Benign neoplasm of rectum: Secondary | ICD-10-CM | POA: Insufficient documentation

## 2016-08-26 DIAGNOSIS — K641 Second degree hemorrhoids: Secondary | ICD-10-CM | POA: Diagnosis not present

## 2016-08-26 DIAGNOSIS — G4730 Sleep apnea, unspecified: Secondary | ICD-10-CM | POA: Insufficient documentation

## 2016-08-26 DIAGNOSIS — Z8042 Family history of malignant neoplasm of prostate: Secondary | ICD-10-CM | POA: Diagnosis not present

## 2016-08-26 DIAGNOSIS — D125 Benign neoplasm of sigmoid colon: Secondary | ICD-10-CM | POA: Insufficient documentation

## 2016-08-26 DIAGNOSIS — Z89422 Acquired absence of other left toe(s): Secondary | ICD-10-CM | POA: Insufficient documentation

## 2016-08-26 DIAGNOSIS — K921 Melena: Secondary | ICD-10-CM

## 2016-08-26 DIAGNOSIS — Z885 Allergy status to narcotic agent status: Secondary | ICD-10-CM | POA: Diagnosis not present

## 2016-08-26 DIAGNOSIS — I10 Essential (primary) hypertension: Secondary | ICD-10-CM | POA: Diagnosis not present

## 2016-08-26 DIAGNOSIS — K621 Rectal polyp: Secondary | ICD-10-CM

## 2016-08-26 DIAGNOSIS — K610 Anal abscess: Secondary | ICD-10-CM | POA: Diagnosis not present

## 2016-08-26 DIAGNOSIS — K529 Noninfective gastroenteritis and colitis, unspecified: Secondary | ICD-10-CM

## 2016-08-26 DIAGNOSIS — K635 Polyp of colon: Secondary | ICD-10-CM

## 2016-08-26 DIAGNOSIS — Z800 Family history of malignant neoplasm of digestive organs: Secondary | ICD-10-CM | POA: Diagnosis not present

## 2016-08-26 DIAGNOSIS — Z8614 Personal history of Methicillin resistant Staphylococcus aureus infection: Secondary | ICD-10-CM | POA: Insufficient documentation

## 2016-08-26 HISTORY — PX: COLONOSCOPY WITH PROPOFOL: SHX5780

## 2016-08-26 HISTORY — PX: POLYPECTOMY: SHX5525

## 2016-08-26 SURGERY — COLONOSCOPY WITH PROPOFOL
Anesthesia: Monitor Anesthesia Care | Wound class: Contaminated

## 2016-08-26 MED ORDER — LIDOCAINE HCL (CARDIAC) 20 MG/ML IV SOLN
INTRAVENOUS | Status: DC | PRN
  Administered 2016-08-26: 40 mg via INTRAVENOUS

## 2016-08-26 MED ORDER — LACTATED RINGERS IV SOLN
INTRAVENOUS | Status: DC
  Administered 2016-08-26: 07:00:00 via INTRAVENOUS

## 2016-08-26 MED ORDER — PROPOFOL 10 MG/ML IV BOLUS
INTRAVENOUS | Status: DC | PRN
  Administered 2016-08-26: 10 mg via INTRAVENOUS
  Administered 2016-08-26 (×2): 20 mg via INTRAVENOUS
  Administered 2016-08-26: 70 mg via INTRAVENOUS
  Administered 2016-08-26: 10 mg via INTRAVENOUS
  Administered 2016-08-26: 20 mg via INTRAVENOUS
  Administered 2016-08-26 (×2): 10 mg via INTRAVENOUS
  Administered 2016-08-26 (×2): 20 mg via INTRAVENOUS

## 2016-08-26 MED ORDER — STERILE WATER FOR IRRIGATION IR SOLN
Status: DC | PRN
  Administered 2016-08-26: 08:00:00

## 2016-08-26 SURGICAL SUPPLY — 23 items
CANISTER SUCT 1200ML W/VALVE (MISCELLANEOUS) ×4 IMPLANT
CLIP HMST 235XBRD CATH ROT (MISCELLANEOUS) ×2 IMPLANT
CLIP RESOLUTION 360 11X235 (MISCELLANEOUS) ×2
FCP ESCP3.2XJMB 240X2.8X (MISCELLANEOUS)
FORCEPS BIOP RAD 4 LRG CAP 4 (CUTTING FORCEPS) ×4 IMPLANT
FORCEPS BIOP RJ4 240 W/NDL (MISCELLANEOUS)
FORCEPS ESCP3.2XJMB 240X2.8X (MISCELLANEOUS) IMPLANT
GOWN CVR UNV OPN BCK APRN NK (MISCELLANEOUS) ×4 IMPLANT
GOWN ISOL THUMB LOOP REG UNIV (MISCELLANEOUS) ×4
INJECTOR VARIJECT VIN23 (MISCELLANEOUS) IMPLANT
KIT DEFENDO VALVE AND CONN (KITS) IMPLANT
KIT ENDO PROCEDURE OLY (KITS) ×4 IMPLANT
MARKER SPOT ENDO TATTOO 5ML (MISCELLANEOUS) IMPLANT
PAD GROUND ADULT SPLIT (MISCELLANEOUS) ×4 IMPLANT
PROBE APC STR FIRE (PROBE) IMPLANT
RETRIEVER NET ROTH 2.5X230 LF (MISCELLANEOUS) IMPLANT
SNARE SHORT THROW 13M SML OVAL (MISCELLANEOUS) ×4 IMPLANT
SNARE SHORT THROW 30M LRG OVAL (MISCELLANEOUS) IMPLANT
SNARE SNG USE RND 15MM (INSTRUMENTS) IMPLANT
SPOT EX ENDOSCOPIC TATTOO (MISCELLANEOUS)
TRAP ETRAP POLY (MISCELLANEOUS) ×4 IMPLANT
VARIJECT INJECTOR VIN23 (MISCELLANEOUS)
WATER STERILE IRR 250ML POUR (IV SOLUTION) ×4 IMPLANT

## 2016-08-26 NOTE — Progress Notes (Signed)
  Procedure Date:  08/26/2016  Pre-operative Diagnosis:  Perianal abscess  Post-operative Diagnosis: Perianal abscess  Procedure:  Incision and drainage of perianal abscess  Surgeon:  [name], MD  Anesthesia:  4 mL of 1% lidocaine  Estimated Blood Loss:  1 ml  Specimens:  None  Complications:  None  Indications for Procedure:  This is a 55 y.o. male who presents with a perianal abscess, with symptoms of pain and drainage on going for two weeks.  The risks and benefits of the procedure were explained to the patient and he was willing to proceed.  Description of Procedure: The patient was correctly identified at bedside.  Appropriate time as were obtained. The patient's perianal area was prepped and draped in usual sterile fashion. Local anesthesia was infiltrated.  A 1 cm x 1 cm cruciate incision was made over the perianal abscess which was located in the right lateral aspect. Using forceps, pockets of pus were broken up and the abscess was appropriately drained. A 1/4 inch iodoform gauze wick was placed inside the cavity and covered with gauze and secured with tape.  The patient tolerated the procedure well and all counts were correct at the end of the case.   [name], MD

## 2016-08-26 NOTE — Progress Notes (Signed)
08/26/2016  Reason for Visit:  Perianal abscess  History of Present Illness: Isaac Banks is a 55 y.o. male who presents with with a perianal abscess. She just had a colonoscopy today with Dr. Allen Norris and was noted to have a perianal abscess on the right. Patient reports that about 2 weeks ago he started noticing a lump on the right side of his perianal area that has been progressively getting worse and more tender. It started draining 3 days ago and at that same time he had a subjective fever with sweats at home. He reports that the bowel prep for his colonoscopy has only made the tenderness is worse due to the multiple bowel movements that he's had and with wiping. He describes it as a pain and burning sensation. Denies any radiation of the pain and denies any tenderness in other areas.   Past Medical History: Past Medical History:  Diagnosis Date  . Hypertension   . MRSA infection 2007  . Sleep apnea    PT DOES NOT USE CPAP  . Ventral hernia      Past Surgical History: Past Surgical History:  Procedure Laterality Date  . APPENDECTOMY    . 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 TONSILLECTOMY    . UMBILICAL HERNIA REPAIR  2010   in [place]  . VENTRAL HERNIA REPAIR N/A 08/11/2015   Procedure: LAPAROSCOPIC VENTRAL HERNIA WITH MESH ;  Surgeon: [name], MD;  Location: [facility] ORS;  Service: General;  Laterality: N/A;    Home Medications: Prior to Admission medications   Medication Sig Start Date End Date Taking? Authorizing Provider  lisinopril (PRINIVIL,ZESTRIL) 10 MG tablet Take 1 tablet (10 mg total) by mouth daily. 08/08/16  Yes Glean Hess, MD  Probiotic Product (PROBIOTIC PO) Take 1 capsule by mouth daily.   Yes Historical Provider, MD  tamsulosin (FLOMAX) 0.4 MG CAPS capsule Take 0.4 mg by mouth daily as needed. Reported on 04/19/2016   Yes Historical Provider, MD    Allergies: Allergies  Allergen Reactions  . Codeine Rash     Social History:  reports that he has never smoked. He has never used smokeless tobacco. He reports that he drinks about 7.8 oz of alcohol per week . He reports that he does not use drugs.   Family History: Family History  Problem Relation Age of Onset  . Prostate cancer Father   . Colon cancer Father     Review of Systems: Review of Systems  Constitutional: Positive for fever (Subjective). Negative for chills.  HENT: Negative for hearing loss.   Eyes: Negative for blurred vision.  Respiratory: Negative for cough.   Cardiovascular: Negative for chest pain and leg swelling.  Gastrointestinal: Positive for diarrhea (From bowel prep for colonoscopy). Negative for abdominal pain, blood in stool, constipation, heartburn, nausea and vomiting.       Pain and drainage around perianal area, worse with bowel movements  Genitourinary: Negative for dysuria and hematuria.  Musculoskeletal: Negative for myalgias.  Skin: Negative for rash.  Neurological: Negative for dizziness.  Psychiatric/Behavioral: Negative for depression.    Physical Exam BP 136/82   Pulse 62   Temp 97.8 F (36.6 C) (Oral)   Ht 5\' 9"  (1.753 m)   Wt 97.1 kg (214 lb)   BMI 31.60 kg/m  CONSTITUTIONAL: No acute distress HEENT:  Normocephalic, atraumatic, extraocular motion intact. NECK: Trachea is midline, and there is no jugular venous distension.  RESPIRATORY:  Lungs are clear, and breath sounds are equal bilaterally. Normal respiratory effort without pathologic use of accessory muscles. CARDIOVASCULAR: Heart is regular without murmurs, gallops, or rubs. GI: The abdomen is soft, nondistended, nontender.  Rectal: Patient has a right-sided 1.5 cm perianal abscess with purulent fluid draining on palpation. This area is tender to palpation. There is no surrounding erythema or induration. MUSCULOSKELETAL:  Normal muscle strength and tone in all four extremities.  No peripheral edema or cyanosis. SKIN: Skin turgor is  normal. There are no pathologic skin lesions.  NEUROLOGIC:  Motor and sensation is grossly normal.  Cranial nerves are grossly intact. PSYCH:  Alert and oriented to person, place and time. Affect is normal.  Laboratory Analysis: No results found for this or any previous visit (from the past 24 hour(s)).  Imaging: No results found.  Assessment and Plan: This is a 55 y.o. male who presents with a perianal abscess.  -This will be drained at bedside here in the office. The risks and benefits of the procedure were explained to the patient he was willing to proceed. -Instructions have been given to the patient. There will be a wick of gauze left in the abscess cavity cover with gauze and tape. He understands that the wick will follow-up within one or 2 days and that he does not need to replace it. He may cover the wound with dry gauze to avoid any staining on his underwear. He will do sitz baths twice a day and return to the clinic on 11/16 for wound check.  -No antibiotics are needed at this point.   [name], [place]

## 2016-08-26 NOTE — Op Note (Signed)
[facility] Gastroenterology Patient Name: Isaac Banks Procedure Date: 08/26/2016 7:18 AM MRN: [id number] Account #: [id number] Date of Birth: 1961-09-24 Admit Type: Outpatient Age: 55 Room: [facility] OR ROOM 01 Gender: Male Note Status: Finalized Procedure:            Colonoscopy Indications:          Chronic diarrhea, Hematochezia Providers:            Lucilla Lame MD, MD Referring MD:         Halina Maidens, MD (Referring MD) Medicines:            Propofol per Anesthesia Complications:        No immediate complications. Procedure:            Pre-Anesthesia Assessment:                       - Prior to the procedure, a History and Physical was                        performed, and patient medications and allergies were                        reviewed. The patient's tolerance of previous                        anesthesia was also reviewed. The risks and benefits of                        the procedure and the sedation options and risks were                        discussed with the patient. All questions were                        answered, and informed consent was obtained. Prior                        Anticoagulants: The patient has taken no previous                        anticoagulant or antiplatelet agents. ASA Grade                        Assessment: II - A patient with mild systemic disease.                        After reviewing the risks and benefits, the patient was                        deemed in satisfactory condition to undergo the                        procedure.                       After obtaining informed consent, the colonoscope was                        passed under direct vision. Throughout the procedure,  the patient's blood pressure, pulse, and oxygen                        saturations were monitored continuously. The was                        introduced through the anus and advanced to the the                         terminal ileum. The colonoscopy was performed without                        difficulty. The patient tolerated the procedure well.                        The quality of the bowel preparation was excellent. Findings:      The terminal ileum appeared normal. Biopsies were taken with a cold       forceps for histology.      A 8 mm polyp was found in the sigmoid colon. The polyp was pedunculated.       The polyp was removed with a cold snare. Resection and retrieval were       complete. To prevent bleeding post-intervention, one hemostatic clip was       successfully placed (MR conditional). There was no bleeding at the end       of the procedure.      A 7 mm polyp was found in the rectum. The polyp was pedunculated. The       polyp was removed with a hot snare. Resection and retrieval were       complete.      Non-bleeding internal hemorrhoids were found during retroflexion. The       hemorrhoids were Grade II (internal hemorrhoids that prolapse but reduce       spontaneously).      The digital rectal exam findings include external anal absess.      Random biopsies were obtained with cold forceps for histology randomly       in the entire colon. Impression:           - The examined portion of the ileum was normal.                        Biopsied.                       - One 8 mm polyp in the sigmoid colon, removed with a                        cold snare. Resected and retrieved. Clip (MR                        conditional) was placed.                       - One 7 mm polyp in the rectum, removed with a hot                        snare. Resected and retrieved.                       -  Non-bleeding internal hemorrhoids.                       - External anal absess found on digital rectal exam. Recommendation:       - Discharge patient to home.                       - Resume previous diet.                       - Continue present medications.                       - Await pathology  results.                       - Repeat colonoscopy in 5 years if polyp adenoma and 10                        years if hyperplastic                       - Refer to a Psychologist, sport and exercise. Procedure Code(s):    --- Professional ---                       [phone], Colonoscopy, flexible; with removal of tumor(s),                        polyp(s), or other lesion(s) by snare technique                       45380, 70, Colonoscopy, flexible; with biopsy, single                        or multiple Diagnosis Code(s):    --- Professional ---                       K52.9, Noninfective gastroenteritis and colitis,                        unspecified                       K92.1, Melena (includes Hematochezia)                       D12.5, Benign neoplasm of sigmoid colon                       K62.1, Rectal polyp CPT copyright 2016 American Medical Association. All rights reserved. The codes documented in this report are preliminary and upon coder review may  be revised to meet current compliance requirements. Lucilla Lame MD, MD 08/26/2016 7:49:39 AM This report has been signed electronically. Number of Addenda: 0 Note Initiated On: 08/26/2016 7:18 AM Scope Withdrawal Time: 0 hours 10 minutes 55 seconds  Total Procedure Duration: 0 hours 13 minutes 11 seconds       [facility]

## 2016-08-26 NOTE — Transfer of Care (Signed)
Immediate Anesthesia Transfer of Care Note  Patient: Suvir Goertz  Procedure(s) Performed: Procedure(s) with comments: COLONOSCOPY WITH PROPOFOL (N/A) - sleep apnea POLYPECTOMY  Patient Location: PACU  Anesthesia Type: MAC  Level of Consciousness: awake, alert  and patient cooperative  Airway and Oxygen Therapy: Patient Spontanous Breathing and Patient connected to supplemental oxygen  Post-op Assessment: Post-op Vital signs reviewed, Patient's Cardiovascular Status Stable, Respiratory Function Stable, Patent Airway and No signs of Nausea or vomiting  Post-op Vital Signs: Reviewed and stable  Complications: No apparent anesthesia complications

## 2016-08-26 NOTE — H&P (Signed)
Lucilla Lame, MD [facility] [address] [place], [place] [address] Phone: [phone] Fax : [phone]  Primary Care Physician:  Halina Maidens, MD Primary Gastroenterologist:  Dr. Allen Norris  Pre-Procedure History & Physical: HPI:  Isaac Banks is a 55 y.o. male is here for an colonoscopy.   Past Medical History:  Diagnosis Date  . Hypertension   . MRSA infection 2007  . Sleep apnea    PT DOES NOT USE CPAP  . Ventral hernia     Past Surgical History:  Procedure Laterality Date  . APPENDECTOMY    . 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 TONSILLECTOMY    . UMBILICAL HERNIA REPAIR  2010   in [place]  . VENTRAL HERNIA REPAIR N/A 08/11/2015   Procedure: LAPAROSCOPIC VENTRAL HERNIA WITH MESH ;  Surgeon: [name], MD;  Location: [facility] ORS;  Service: General;  Laterality: N/A;    Prior to Admission medications   Medication Sig Start Date End Date Taking? Authorizing Provider  lisinopril (PRINIVIL,ZESTRIL) 10 MG tablet Take 1 tablet (10 mg total) by mouth daily. 08/08/16  Yes Glean Hess, MD  polyethylene glycol-electrolytes (TRILYTE) 420 g solution Drink one 8 oz glass every 20 mins until stools are clear. 08/12/16  Yes Lucilla Lame, MD  Probiotic Product (PROBIOTIC PO) Take 1 capsule by mouth daily.   Yes Historical Provider, MD  tamsulosin (FLOMAX) 0.4 MG CAPS capsule Take 0.4 mg by mouth daily as needed. Reported on 04/19/2016   Yes Historical Provider, MD  etodolac (LODINE) 500 MG tablet Take 500 mg by mouth 2 (two) times daily.    Historical Provider, MD    Allergies as of 08/12/2016 - Review Complete 08/12/2016  Allergen Reaction Noted  . Codeine  02/26/2015    Family History  Problem Relation Age of Onset  . Prostate cancer Father   . Colon cancer Father     Social History   Social History  . Marital status: Single    Spouse name: N/A  . Number of children: N/A  . Years of education: N/A   Occupational  History  . Not on file.   Social History Main Topics  . Smoking status: Never Smoker  . Smokeless tobacco: Never Used  . Alcohol use 7.8 oz/week    10 Standard drinks or equivalent, 3 Cans of beer per week     Comment:    . Drug use: No  . Sexual activity: Yes   Other Topics Concern  . Not on file   Social History Narrative  . No narrative on file    Review of Systems: See HPI, otherwise negative ROS  Physical Exam: BP 126/84   Pulse (!) 55   Temp 97.7 F (36.5 C) (Temporal)   Resp 16   Ht 5\' 9"  (1.753 m)   Wt 209 lb (94.8 kg)   SpO2 99%   BMI 30.86 kg/m  General:   Alert,  pleasant and cooperative in NAD Head:  Normocephalic and atraumatic. Neck:  Supple; no masses or thyromegaly. Lungs:  Clear throughout to auscultation.    Heart:  Regular rate and rhythm. Abdomen:  Soft, nontender and nondistended. Normal bowel sounds, without guarding, and without rebound.   Neurologic:  Alert and  oriented x4;  grossly normal neurologically.  Impression/Plan: Isaac Banks is here for an colonoscopy to be performed for hematochezia and diarrhea.  Risks, benefits, limitations, and alternatives regarding  colonoscopy have been  reviewed with the patient.  Questions have been answered.  All parties agreeable.   Lucilla Lame, MD  08/26/2016, 7:21 AM

## 2016-08-26 NOTE — Anesthesia Preprocedure Evaluation (Signed)
Anesthesia Evaluation  Patient identified by MRN, date of birth, ID band Patient awake    Reviewed: Allergy & Precautions, H&P , NPO status , Patient's Chart, lab work & pertinent test results, reviewed documented beta blocker date and time   Airway Mallampati: II  TM Distance: >3 FB Neck ROM: full    Dental no notable dental hx.    Pulmonary sleep apnea ,    Pulmonary exam normal breath sounds clear to auscultation       Cardiovascular Exercise Tolerance: Good hypertension,  Rhythm:regular Rate:Normal     Neuro/Psych negative neurological ROS  negative psych ROS   GI/Hepatic negative GI ROS, Neg liver ROS,   Endo/Other  negative endocrine ROS  Renal/GU negative Renal ROS  negative genitourinary   Musculoskeletal   Abdominal   Peds  Hematology negative hematology ROS (+)   Anesthesia Other Findings   Reproductive/Obstetrics negative OB ROS                             Anesthesia Physical Anesthesia Plan  ASA: II  Anesthesia Plan: MAC   Post-op Pain Management:    Induction:   Airway Management Planned:   Additional Equipment:   Intra-op Plan:   Post-operative Plan:   Informed Consent: I have reviewed the patients History and Physical, chart, labs and discussed the procedure including the risks, benefits and alternatives for the proposed anesthesia with the patient or authorized representative who has indicated his/her understanding and acceptance.   Dental Advisory Given  Plan Discussed with: CRNA  Anesthesia Plan Comments:         Anesthesia Quick Evaluation

## 2016-08-26 NOTE — Anesthesia Postprocedure Evaluation (Signed)
Anesthesia Post Note  Patient: Isaac Banks  Procedure(s) Performed: Procedure(s) (LRB): COLONOSCOPY WITH PROPOFOL (N/A) POLYPECTOMY  Patient location during evaluation: PACU Anesthesia Type: MAC Level of consciousness: awake and alert Pain management: pain level controlled Vital Signs Assessment: post-procedure vital signs reviewed and stable Respiratory status: spontaneous breathing, nonlabored ventilation, respiratory function stable and patient connected to nasal cannula oxygen Cardiovascular status: stable and blood pressure returned to baseline Anesthetic complications: no    Alisa Graff

## 2016-08-26 NOTE — Anesthesia Procedure Notes (Signed)
Procedure Name: MAC Performed by: Melodie Ashworth Pre-anesthesia Checklist: Patient identified, Emergency Drugs available, Suction available, Timeout performed and Patient being monitored Patient Re-evaluated:Patient Re-evaluated prior to inductionOxygen Delivery Method: Nasal cannula Placement Confirmation: positive ETCO2       

## 2016-08-26 NOTE — Patient Instructions (Addendum)
Please place dry gauze on the area until the drainage has stopped. Please take a sitz bath after bowel movements. Please see your follow up appointment listed below.    How to Take a Sitz Bath A sitz bath is a warm water bath that is taken while you are sitting down. The water should only come up to your hips and should cover your buttocks. Your health care provider may recommend a sitz bath to help you:   Clean the lower part of your body, including your genital area.  With itching.  With pain.  With sore muscles or muscles that tighten or spasm. HOW TO TAKE A SITZ BATH Take 3-4 sitz baths per day or as told by your health care provider. 1. Partially fill a bathtub with warm water. You will only need the water to be deep enough to cover your hips and buttocks when you are sitting in it. 2. If your health care provider told you to put medicine in the water, follow the directions exactly. 3. Sit in the water and open the tub drain a little. 4. Turn on the warm water again to keep the tub at the correct level. Keep the water running constantly. 5. Soak in the water for 15-20 minutes or as told by your health care provider. 6. After the sitz bath, pat the affected area dry first. Do not rub it. 7. Be careful when you stand up after the sitz bath because you may feel dizzy. SEEK MEDICAL CARE IF:  Your symptoms get worse. Do not continue with sitz baths if your symptoms get worse.  You have new symptoms. Do not continue with sitz baths until you talk with your health care provider.   This information is not intended to replace advice given to you by your health care provider. Make sure you discuss any questions you have with your health care provider.   Document Released: 06/22/2004 Document Revised: 02/14/2015 Document Reviewed: 09/28/2014 Elsevier Interactive Patient Education [employer].

## 2016-08-28 ENCOUNTER — Encounter: Payer: Self-pay | Admitting: Gastroenterology

## 2016-08-29 ENCOUNTER — Encounter: Payer: Self-pay | Admitting: Surgery

## 2016-08-29 ENCOUNTER — Encounter: Payer: Self-pay | Admitting: Gastroenterology

## 2016-08-29 ENCOUNTER — Ambulatory Visit (INDEPENDENT_AMBULATORY_CARE_PROVIDER_SITE_OTHER): Payer: BLUE CROSS/BLUE SHIELD | Admitting: Surgery

## 2016-08-29 VITALS — BP 126/77 | HR 64 | Temp 98.4°F | Ht 69.0 in | Wt 215.0 lb

## 2016-08-29 DIAGNOSIS — K610 Anal abscess: Secondary | ICD-10-CM

## 2016-08-29 NOTE — Progress Notes (Signed)
08/29/2016  HPI: Patient is status post incision and drainage of right-sided perianal abscess on 11/14. He presents today for wound check. He reports that his pain from 2 days ago has improved but he still having some discomfort and noticing some drainage. A wick that was placed after the procedure fell off that same day after having a bowel movement.  Vital signs: BP 126/77   Pulse 64   Temp 98.4 F (36.9 C) (Oral)   Ht 5\' 9"  (1.753 m)   Wt 97.5 kg (215 lb)   BMI 31.75 kg/m    Physical Exam: Constitutional: No acute distress Rectal: Patient's right-sided wound with minimal to no purulent drainage. No induration surrounding the incision. Mild discomfort to palpation but no new areas of infection noted.  Assessment/Plan: 55 year old male status post incision and drainage of right-sided perianal abscess on 11/14.  -Have reassured the patient that this will continue to heal and that he should continue doing sitz baths twice a day and after each bowel movement and continue high-fiber diet with Metamucil to improve his bowel movements. No further packing or antibiotics are needed at this point. -Patient will follow-up next week if needed for another wound check but he knows that if he's feeling well and has had no problems that he may cancel the appointment.   [name], [place]

## 2016-08-29 NOTE — Patient Instructions (Signed)
We would like for you to increase Fiber in your diet. Please increase water to 72 ounces a day. Please continue to take sitz baths twice daily and after each bowel movement. Please see your follow up appointment listed below.

## 2016-09-01 ENCOUNTER — Encounter: Payer: Self-pay | Admitting: Gastroenterology

## 2016-09-04 ENCOUNTER — Encounter: Payer: BLUE CROSS/BLUE SHIELD | Admitting: Surgery

## 2016-09-04 ENCOUNTER — Telehealth: Payer: Self-pay | Admitting: Surgery

## 2016-09-04 NOTE — Telephone Encounter (Signed)
Had patient checked in for a post op appointment. Explained to him that Dr. Dahlia Byes had an emergency and he was running behind at least 30 minutes. Patient left and stated he would call me back to reschedule this appointment. He said he just couldn't wait.

## 2016-09-11 ENCOUNTER — Other Ambulatory Visit: Payer: Self-pay

## 2016-09-11 ENCOUNTER — Encounter: Payer: Self-pay | Admitting: Surgery

## 2016-09-11 ENCOUNTER — Ambulatory Visit (INDEPENDENT_AMBULATORY_CARE_PROVIDER_SITE_OTHER): Payer: BLUE CROSS/BLUE SHIELD | Admitting: Surgery

## 2016-09-11 VITALS — BP 127/80 | HR 68 | Temp 98.2°F | Ht 69.0 in | Wt 211.6 lb

## 2016-09-11 DIAGNOSIS — K610 Anal abscess: Secondary | ICD-10-CM

## 2016-09-11 NOTE — Patient Instructions (Signed)
Please see your follow up appointment listed below. Please call our office if you have any questions or concerns.

## 2016-09-11 NOTE — Progress Notes (Signed)
Outpatient postop visit  09/11/2016  Isaac Banks is an 55 y.o. male.    Procedure: I&D of perirectal abscess mid-November  CC: Ongoing pain and drainage  HPI: Patient states that he still having some bloody drainage but no purulence no foul odor he denies fevers or chills he has pain when he has a bowel movement. No nausea vomiting.  Medications reviewed.    Physical Exam:  BP 127/80   Pulse 68   Temp 98.2 F (36.8 C) (Oral)   Ht 5\' 9"  (1.753 m)   Wt 211 lb 9.6 oz (96 kg)   BMI 31.25 kg/m     PE: Right-sided opening with minimal expressible clear fluid. Nontender. No erythema    Assessment/Plan:  Status post I&D of a perirectal abscess. He is 2 weeks status post the I&D and it is much too early to consider this a fistula. He just has an open wound where the drainage was performed. Would recommend reexamination in another week.  Florene Glen, MD, FACS

## 2016-09-17 ENCOUNTER — Ambulatory Visit (INDEPENDENT_AMBULATORY_CARE_PROVIDER_SITE_OTHER): Payer: BLUE CROSS/BLUE SHIELD | Admitting: Internal Medicine

## 2016-09-17 ENCOUNTER — Encounter: Payer: Self-pay | Admitting: Internal Medicine

## 2016-09-17 VITALS — BP 105/76 | HR 84 | Temp 98.0°F | Resp 16 | Ht 69.0 in | Wt 213.0 lb

## 2016-09-17 DIAGNOSIS — N481 Balanitis: Secondary | ICD-10-CM | POA: Diagnosis not present

## 2016-09-17 LAB — POCT URINALYSIS DIPSTICK
Bilirubin, UA: NEGATIVE
Blood, UA: NEGATIVE
GLUCOSE UA: NEGATIVE
Ketones, UA: NEGATIVE
Leukocytes, UA: NEGATIVE
NITRITE UA: NEGATIVE
Protein, UA: NEGATIVE
SPEC GRAV UA: 1.025
UROBILINOGEN UA: 0.2
pH, UA: 5

## 2016-09-17 MED ORDER — METRONIDAZOLE 500 MG PO TABS: 500.0000 mg | ORAL_TABLET | Freq: Two times a day (BID) | ORAL | 0 refills | Status: DC

## 2016-09-17 NOTE — Progress Notes (Signed)
Date:  09/17/2016   Name:  Isaac Banks   DOB:  Jan 17, 1961   MRN:  [id number]   Chief Complaint: Groin Burn (patient had cyst removed on buttock and is taking sitz baths. When having sex his foreskin swells bad and then he gets cuts as if he has yeast or infection. ) and Urinary Frequency (denies any other uti symptoms) Penis Pain  The patient's primary symptoms include genital itching and penile pain. The patient's pertinent negatives include no penile discharge, scrotal swelling or testicular pain. This is a new problem. The current episode started 1 to 4 weeks ago. The problem occurs intermittently. The problem has been waxing and waning. Pertinent negatives include no chest pain, chills, coughing, dysuria, fever, frequency or shortness of breath. The symptoms are aggravated by intercourse. He has tried nothing for the symptoms. He is sexually active.     Review of Systems  Constitutional: Negative for chills, fatigue and fever.  Respiratory: Negative for cough and shortness of breath.   Cardiovascular: Negative for chest pain.  Genitourinary: Positive for genital sores and penile pain. Negative for discharge, dysuria, frequency, scrotal swelling and testicular pain.    Patient Active Problem List   Diagnosis Date Noted  . Perianal abscess 08/26/2016  . Noninfectious diarrhea   . Blood in stool   . Polyp of sigmoid colon   . Rectal polyp   . Hyperlipidemia 08/09/2016  . Plantar fasciitis, right 08/08/2016  . Cardiomegaly 08/10/2015  . Benign prostatic hyperplasia with urinary obstruction 02/26/2015  . Essential (primary) hypertension 02/26/2015  . Family history of colon cancer 02/26/2015  . Neoplasm of uncertain behavior of skin 02/26/2015    Prior to Admission medications   Medication Sig Start Date End Date Taking? Authorizing Provider  lisinopril (PRINIVIL,ZESTRIL) 10 MG tablet Take 1 tablet (10 mg total) by mouth daily. 08/08/16  Yes Glean Hess, MD  Probiotic  Product (PROBIOTIC PO) Take 1 capsule by mouth daily.   Yes Historical Provider, MD  tamsulosin (FLOMAX) 0.4 MG CAPS capsule Take 0.4 mg by mouth daily as needed. Reported on 04/19/2016   Yes Historical Provider, MD    Allergies  Allergen Reactions  . Codeine Rash    Past Surgical History:  Procedure Laterality Date  . APPENDECTOMY    . COLONOSCOPY WITH PROPOFOL N/A 08/26/2016   Procedure: COLONOSCOPY WITH PROPOFOL;  Surgeon: Lucilla Lame, MD;  Location: [facility];  Service: Endoscopy;  Laterality: N/A;  sleep apnea  . POLYPECTOMY  08/26/2016   Procedure: POLYPECTOMY;  Surgeon: Lucilla Lame, MD;  Location: [facility];  Service: Endoscopy;;  . 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 TONSILLECTOMY    . UMBILICAL HERNIA REPAIR  2010   in [place]  . VENTRAL HERNIA REPAIR N/A 08/11/2015   Procedure: LAPAROSCOPIC VENTRAL HERNIA WITH MESH ;  Surgeon: [name], MD;  Location: [facility];  Service: General;  Laterality: N/A;    Social History  Substance Use Topics  . Smoking status: Never Smoker  . Smokeless tobacco: Never Used  . Alcohol use 7.8 oz/week    10 Standard drinks or equivalent, 3 Cans of beer per week     Comment:       Medication list has been reviewed and updated.   Physical Exam  Constitutional: He is oriented to person, place, and time. He appears well-developed. No distress.  HENT:  Head: Normocephalic and atraumatic.  Pulmonary/Chest:  Effort normal. No respiratory distress.  Abdominal: Hernia confirmed negative in the right inguinal area and confirmed negative in the left inguinal area.  Genitourinary: Testes normal and penis normal. Uncircumcised. No paraphimosis or penile erythema. No discharge found.  Musculoskeletal: Normal range of motion.  Lymphadenopathy:       Right: No inguinal adenopathy present.       Left: No inguinal adenopathy present.  Neurological: He is alert and oriented to person,  place, and time.  Skin: Skin is warm and dry. No rash noted.  Psychiatric: He has a normal mood and affect. His behavior is normal. Thought content normal.  Nursing note and vitals reviewed.   BP 105/76   Pulse 84   Temp 98 F (36.7 C)   Resp 16   Ht 5\' 9"  (1.753 m)   Wt 213 lb (96.6 kg)   SpO2 97%   BMI 31.45 kg/m   Assessment and Plan: 1. Balanitis Follow up with Urology if persistent - metroNIDAZOLE (FLAGYL) 500 MG tablet; Take 1 tablet (500 mg total) by mouth 2 (two) times daily.  Dispense: 14 tablet; Refill: 0   Halina Maidens, MD [facility]  09/17/2016

## 2016-09-18 ENCOUNTER — Ambulatory Visit: Payer: Self-pay | Admitting: Surgery

## 2016-09-30 ENCOUNTER — Ambulatory Visit (INDEPENDENT_AMBULATORY_CARE_PROVIDER_SITE_OTHER): Payer: BLUE CROSS/BLUE SHIELD | Admitting: Surgery

## 2016-09-30 ENCOUNTER — Encounter: Payer: Self-pay | Admitting: Surgery

## 2016-09-30 VITALS — BP 127/80 | HR 64 | Temp 98.1°F | Ht 69.0 in | Wt 214.0 lb

## 2016-09-30 DIAGNOSIS — K610 Anal abscess: Secondary | ICD-10-CM

## 2016-09-30 NOTE — Progress Notes (Signed)
09/30/2016  HPI: Patient is status post I&D of perianal abscess on 11/13. Patient presents for follow-up appointment. Reports that he has very minimal pain and only very occasional episodes of blood at the wound site. He does notice that more when he is having a large bowel movement or heat when he strains. Otherwise denies any fevers, chills, worsening pain in the perianal area.  Vital signs: BP 127/80   Pulse 64   Temp 98.1 F (36.7 C) (Oral)   Ht 5\' 9"  (1.753 m)   Wt 97.1 kg (214 lb)   BMI 31.60 kg/m    Physical Exam: Constitutional: No acute distress Rectal: Status post I&D of right perianal abscess. Wound is almost healed with only a superficial layer of granulation tissue. There is no purulent drainage. No bleeding. Minimal soreness to palpation.  Assessment/Plan: 55 year old male status post I&D of right perianal abscess.  -Patient may follow-up on an as-needed basis. Have reassured that the wound will continue to heal and that at this point there is no evidence of further infection or anything that requires further drainage. She should continue doing sitz baths and continue taking fiber to allow the wound to heal well. If there are any issues with wound healing down the line he may come back as a follow-up appointment. I did offer him application of silver nitrate today on the wound but he deferred this for now.   [name], [place]

## 2016-09-30 NOTE — Patient Instructions (Signed)
Please call our office with any questions or concerns. 

## 2016-10-18 ENCOUNTER — Telehealth: Payer: Self-pay

## 2016-10-18 NOTE — Telephone Encounter (Signed)
Left vm for pt to return my call regarding a bill he received for his colonoscopy. Pt upset about this and is wanting our office to recode and send in as a screening. Pt was advised he was sent here for a rectal fissure and not a screening. Pt stated we would change the code if we wanted to get paid. This will be forwarded to Dr. Allen Norris. Message sent to pt's PCP, Dr. Army Melia advising her of this call.

## 2016-10-24 ENCOUNTER — Telehealth: Payer: Self-pay | Admitting: Gastroenterology

## 2016-10-24 NOTE — Telephone Encounter (Signed)
Pt returned my call today talking about the bill her received for his colonoscopy. He stated that he came in for a hemorrhoid and got scheduled for a colonoscopy. I was asking pt and trying to explain to him what Dr. Allen Norris had noted in his chart note but he kept interrupting me. He said he don't care who but someone better change the diagnosis code to a screening or we will not get paid. He said if he has to he will go to the medical board. I tried to explain to the pt again, he had blood mixed in with his stool so a colonoscopy would be ordered as there could be an issue. Again, he interrupted me saying he didn't come for an office visit to be set up for a colonoscopy. He stated if he knew it would have not been coded properly he would have made a different decision about scheduling.  I advised him I would forward this to Dr. Allen Norris.

## 2016-10-24 NOTE — Telephone Encounter (Signed)
I have submitted a report to the compliance and privacy department about this patients threats. I will await guidance from them before any further action is taken.

## 2016-10-24 NOTE — Telephone Encounter (Signed)
Patient stated he really need to talk to you regarding the billing/coding on his colonoscopy. Please call today!

## 2017-01-30 ENCOUNTER — Encounter
Admission: RE | Admit: 2017-01-30 | Discharge: 2017-01-30 | Disposition: A | Discharge status: Home / Self Care | Payer: Federal, State, Local not specified - PPO | Source: Ambulatory Visit | Attending: Surgery | Admitting: Surgery

## 2017-01-30 HISTORY — DX: Gastro-esophageal reflux disease without esophagitis: K21.9

## 2017-01-30 NOTE — Patient Instructions (Signed)
  Your procedure is scheduled on: 02-07-17 (Friday) Report to Same Day Surgery 2nd floor medical mall [facility] Entrance-take elevator on left to 2nd floor.  Check in with surgery information desk.) To find out your arrival time please call [phone] between 1PM - 3PM on 02-06-17 (Thursday)  Remember: Instructions that are not followed completely may result in serious medical risk, up to and including death, or upon the discretion of your surgeon and anesthesiologist your surgery may need to be rescheduled.    _x___ 1. Do not eat food or drink liquids after midnight. No gum chewing or  hard candies.     __x__ 2. No Alcohol for 24 hours before or after surgery.   __x__3. No Smoking for 24 prior to surgery.   ____  4. Bring all medications with you on the day of surgery if instructed.    __x__ 5. Notify your doctor if there is any change in your medical condition     (cold, fever, infections).     Do not wear jewelry, make-up, hairpins, clips or nail polish.  Do not wear lotions, powders, or perfumes. You may wear deodorant.  Do not shave 48 hours prior to surgery. Men may shave face and neck.  Do not bring valuables to the hospital.    [facility] is not responsible for any belongings or valuables.               Contacts, dentures or bridgework may not be worn into surgery.  Leave your suitcase in the car. After surgery it may be brought to your room.  For patients admitted to the hospital, discharge time is determined by your treatment team.   Patients discharged the day of surgery will not be allowed to drive home.  You will need someone to drive you home and stay with you the night of your procedure.    Please read over the following fact sheets that you were given:   [facility] Preparing for Surgery and or MRSA Information   _x___ Take anti-hypertensive (unless it includes a diuretic), cardiac, seizure, asthma,     anti-reflux and psychiatric medicines. These include:  1.  LISINOPRIL  2.  3.  4.  5.  6.  ____Fleets enema or Magnesium Citrate as directed.   _x___ Use CHG Soap or sage wipes as directed on instruction sheet   ____ Use inhalers on the day of surgery and bring to hospital day of surgery  ____ Stop Metformin and Janumet 2 days prior to surgery.    ____ Take 1/2 of usual insulin dose the night before surgery and none on the morning surgery.   _x___ Follow recommendations from Cardiologist, Pulmonologist or PCP regarding stopping Aspirin, Coumadin, Pllavix ,Eliquis, Effient, or Pradaxa, and Pletal-STOP ASPIRIN NOW  X____Stop Anti-inflammatories such as Advil, Aleve, Ibuprofen, Motrin, Naproxen, Naprosyn, Goodies powders or aspirin products NOW-OK to take Tylenol   ____ Stop supplements until after surgery.    ____ Bring C-Pap to the hospital.

## 2017-01-30 NOTE — Pre-Procedure Instructions (Signed)
ECG 12-lead1/17/2017 [place] Component Name Value Ref Range  Vent Rate (bpm) 91   PR Interval (msec) 164   QRS Interval (msec) 80   QT Interval (msec) 350   QTc (msec) 430   Result Narrative  Normal sinus rhythm Normal ECG When compared with ECG of 31-Oct-2015 11:13, No significant change was found I reviewed and concur with this report. Electronically signed GH:WEXH, LAURA ([id number]) on 10/31/2015 5:42:01 PM  Status Results Details    Hospital Encounter on 10/31/2015 [place]")' href="[email or web address]

## 2017-02-03 ENCOUNTER — Encounter
Admission: RE | Admit: 2017-02-03 | Discharge: 2017-02-03 | Disposition: A | Discharge status: Home / Self Care | Payer: Federal, State, Local not specified - PPO | Source: Ambulatory Visit | Attending: Surgery | Admitting: Surgery

## 2017-02-03 DIAGNOSIS — Z01810 Encounter for preprocedural cardiovascular examination: Secondary | ICD-10-CM | POA: Insufficient documentation

## 2017-02-03 DIAGNOSIS — I10 Essential (primary) hypertension: Secondary | ICD-10-CM | POA: Insufficient documentation

## 2017-02-03 LAB — SURGICAL PCR SCREEN
MRSA, PCR: NEGATIVE
Staphylococcus aureus: NEGATIVE

## 2017-02-07 ENCOUNTER — Ambulatory Visit: Payer: BLUE CROSS/BLUE SHIELD | Admitting: Internal Medicine

## 2017-02-07 ENCOUNTER — Encounter
Admission: RE | Disposition: A | Discharge status: Home / Self Care | Payer: Self-pay | Source: Ambulatory Visit | Attending: Surgery

## 2017-02-07 ENCOUNTER — Ambulatory Visit: Payer: Federal, State, Local not specified - PPO | Admitting: Anesthesiology

## 2017-02-07 ENCOUNTER — Ambulatory Visit
Admission: RE | Admit: 2017-02-07 | Discharge: 2017-02-07 | Disposition: A | Discharge status: Home / Self Care | Payer: Federal, State, Local not specified - PPO | Source: Ambulatory Visit | Attending: Surgery | Admitting: Surgery

## 2017-02-07 ENCOUNTER — Encounter: Payer: Self-pay | Admitting: Anesthesiology

## 2017-02-07 DIAGNOSIS — K603 Anal fistula: Secondary | ICD-10-CM | POA: Insufficient documentation

## 2017-02-07 DIAGNOSIS — Z7982 Long term (current) use of aspirin: Secondary | ICD-10-CM | POA: Insufficient documentation

## 2017-02-07 DIAGNOSIS — Z79899 Other long term (current) drug therapy: Secondary | ICD-10-CM | POA: Insufficient documentation

## 2017-02-07 DIAGNOSIS — R39198 Other difficulties with micturition: Secondary | ICD-10-CM | POA: Insufficient documentation

## 2017-02-07 DIAGNOSIS — I10 Essential (primary) hypertension: Secondary | ICD-10-CM | POA: Insufficient documentation

## 2017-02-07 HISTORY — PX: ANAL FISTULOTOMY: SHX6423

## 2017-02-07 SURGERY — ANAL FISTULOTOMY
Anesthesia: General | Wound class: Contaminated

## 2017-02-07 MED ORDER — BUPIVACAINE-EPINEPHRINE (PF) 0.5% -1:200000 IJ SOLN
INTRAMUSCULAR | Status: AC
  Filled 2017-02-07: qty 30

## 2017-02-07 MED ORDER — FAMOTIDINE 20 MG PO TABS
ORAL_TABLET | ORAL | Status: AC
  Administered 2017-02-07: 20 mg via ORAL
  Filled 2017-02-07: qty 1

## 2017-02-07 MED ORDER — FENTANYL CITRATE (PF) 100 MCG/2ML IJ SOLN
INTRAMUSCULAR | Status: DC | PRN
  Administered 2017-02-07 (×2): 50 ug via INTRAVENOUS

## 2017-02-07 MED ORDER — BUPIVACAINE-EPINEPHRINE (PF) 0.5% -1:200000 IJ SOLN
INTRAMUSCULAR | Status: DC | PRN
  Administered 2017-02-07: 3.5 mL via PERINEURAL

## 2017-02-07 MED ORDER — FAMOTIDINE 20 MG PO TABS
20.0000 mg | ORAL_TABLET | Freq: Once | ORAL | Status: AC
  Administered 2017-02-07: 20 mg via ORAL

## 2017-02-07 MED ORDER — PROPOFOL 10 MG/ML IV BOLUS
INTRAVENOUS | Status: DC | PRN
  Administered 2017-02-07: 180 mg via INTRAVENOUS

## 2017-02-07 MED ORDER — LIDOCAINE HCL (CARDIAC) 20 MG/ML IV SOLN
INTRAVENOUS | Status: DC | PRN
  Administered 2017-02-07: 40 mg via INTRAVENOUS

## 2017-02-07 MED ORDER — GLYCOPYRROLATE 0.2 MG/ML IJ SOLN
INTRAMUSCULAR | Status: DC | PRN
  Administered 2017-02-07: .2 mg via INTRAVENOUS

## 2017-02-07 MED ORDER — FENTANYL CITRATE (PF) 100 MCG/2ML IJ SOLN: 25.0000 ug | INTRAMUSCULAR | Status: DC | PRN

## 2017-02-07 MED ORDER — LIDOCAINE HCL 2 % EX GEL
CUTANEOUS | Status: AC
  Filled 2017-02-07: qty 5

## 2017-02-07 MED ORDER — HYDROCODONE-ACETAMINOPHEN 5-325 MG PO TABS: 1.0000 | ORAL_TABLET | ORAL | Status: DC | PRN

## 2017-02-07 MED ORDER — ONDANSETRON HCL 4 MG/2ML IJ SOLN: 4.0000 mg | Freq: Once | INTRAMUSCULAR | Status: DC | PRN

## 2017-02-07 MED ORDER — LIDOCAINE HCL (PF) 2 % IJ SOLN
INTRAMUSCULAR | Status: AC
  Filled 2017-02-07: qty 2

## 2017-02-07 MED ORDER — MIDAZOLAM HCL 2 MG/2ML IJ SOLN
INTRAMUSCULAR | Status: AC
  Filled 2017-02-07: qty 2

## 2017-02-07 MED ORDER — ONDANSETRON HCL 4 MG/2ML IJ SOLN
INTRAMUSCULAR | Status: DC | PRN
  Administered 2017-02-07: 4 mg via INTRAVENOUS

## 2017-02-07 MED ORDER — LACTATED RINGERS IV SOLN
INTRAVENOUS | Status: DC
  Administered 2017-02-07 (×2): via INTRAVENOUS

## 2017-02-07 MED ORDER — HYDROCODONE-ACETAMINOPHEN 5-325 MG PO TABS: 1.0000 | ORAL_TABLET | ORAL | 0 refills | Status: DC | PRN

## 2017-02-07 MED ORDER — GLYCOPYRROLATE 0.2 MG/ML IJ SOLN
INTRAMUSCULAR | Status: AC
  Filled 2017-02-07: qty 1

## 2017-02-07 MED ORDER — MIDAZOLAM HCL 2 MG/2ML IJ SOLN
INTRAMUSCULAR | Status: DC | PRN
  Administered 2017-02-07: 2 mg via INTRAVENOUS

## 2017-02-07 MED ORDER — FENTANYL CITRATE (PF) 100 MCG/2ML IJ SOLN
INTRAMUSCULAR | Status: AC
  Filled 2017-02-07: qty 2

## 2017-02-07 MED ORDER — PROPOFOL 10 MG/ML IV BOLUS
INTRAVENOUS | Status: AC
  Filled 2017-02-07: qty 20

## 2017-02-07 SURGICAL SUPPLY — 25 items
BLADE SURG 15 STRL LF DISP TIS (BLADE) ×1 IMPLANT
BLADE SURG 15 STRL SS (BLADE) ×2
CANISTER SUCT 1200ML W/VALVE (MISCELLANEOUS) ×3 IMPLANT
DRAPE LAPAROTOMY 100X77 ABD (DRAPES) ×3 IMPLANT
DRAPE LEGGINS SURG 28X43 STRL (DRAPES) ×3 IMPLANT
ELECT REM PT RETURN 9FT ADLT (ELECTROSURGICAL) ×3
ELECTRODE REM PT RTRN 9FT ADLT (ELECTROSURGICAL) ×1 IMPLANT
GAUZE SPONGE 4X4 12PLY STRL (GAUZE/BANDAGES/DRESSINGS) ×3 IMPLANT
GLOVE BIO SURGEON STRL SZ7.5 (GLOVE) ×18 IMPLANT
GOWN STRL REUS W/ TWL LRG LVL3 (GOWN DISPOSABLE) ×4 IMPLANT
GOWN STRL REUS W/TWL LRG LVL3 (GOWN DISPOSABLE) ×8
KIT RM TURNOVER CYSTO AR (KITS) ×3 IMPLANT
LABEL OR SOLS (LABEL) ×3 IMPLANT
NEEDLE HYPO 25X1 1.5 SAFETY (NEEDLE) ×3 IMPLANT
NS IRRIG 500ML POUR BTL (IV SOLUTION) ×3 IMPLANT
PACK BASIN MINOR ARMC (MISCELLANEOUS) ×3 IMPLANT
PAD PREP 24X41 OB/GYN DISP (PERSONAL CARE ITEMS) ×3 IMPLANT
SOL PREP PVP 2OZ (MISCELLANEOUS) ×3
SOLUTION PREP PVP 2OZ (MISCELLANEOUS) ×1 IMPLANT
SURGILUBE 2OZ TUBE FLIPTOP (MISCELLANEOUS) ×3 IMPLANT
SUT CHROMIC 3 0 SH 27 (SUTURE) ×3 IMPLANT
SUT CHROMIC 5 0 RB 1 27 (SUTURE) ×6 IMPLANT
SUT VIC AB 3-0 SH 27 (SUTURE) ×2
SUT VIC AB 3-0 SH 27X BRD (SUTURE) ×1 IMPLANT
SYRINGE 10CC LL (SYRINGE) ×3 IMPLANT

## 2017-02-07 NOTE — Anesthesia Procedure Notes (Signed)
Procedure Name: LMA Insertion Date/Time: 02/07/2017 7:35 AM Performed by: Allean Found Pre-anesthesia Checklist: Patient identified, Emergency Drugs available, Suction available, Patient being monitored and Timeout performed Patient Re-evaluated:Patient Re-evaluated prior to inductionOxygen Delivery Method: Circle system utilized Preoxygenation: Pre-oxygenation with 100% oxygen Intubation Type: IV induction Ventilation: Mask ventilation without difficulty LMA: LMA inserted LMA Size: 5.0 Number of attempts: 1 Placement Confirmation: ETT inserted through vocal cords under direct vision,  positive ETCO2 and breath sounds checked- equal and bilateral Dental Injury: Teeth and Oropharynx as per pre-operative assessment

## 2017-02-07 NOTE — Anesthesia Post-op Follow-up Note (Cosign Needed)
Anesthesia QCDR form completed.        

## 2017-02-07 NOTE — Discharge Instructions (Addendum)
Take Tylenol or Norco if needed for pain.  Should not drive or do anything dangerous when taking Norco.  Remove dressing later today or tomorrow. May then shower and/or sit  in warm water.  Tuck gauze or pad in underwear and change as needed for drainage.  AMBULATORY SURGERY  DISCHARGE INSTRUCTIONS   1) The drugs that you were given will stay in your system until tomorrow so for the next 24 hours you should not:  A) Drive an automobile B) Make any legal decisions C) Drink any alcoholic beverage   2) You may resume regular meals tomorrow.  Today it is better to start with liquids and gradually work up to solid foods.  You may eat anything you prefer, but it is better to start with liquids, then soup and crackers, and gradually work up to solid foods.   3) Please notify your doctor immediately if you have any unusual bleeding, trouble breathing, redness and pain at the surgery site, drainage, fever, or pain not relieved by medication.    4) Additional Instructions:        Please contact your physician with any problems or Same Day Surgery at [phone], Monday through Friday 6 am to 4 pm, or [place] at [facility] number at [phone].

## 2017-02-07 NOTE — Anesthesia Preprocedure Evaluation (Signed)
Anesthesia Evaluation  Patient identified by MRN, date of birth, ID band Patient awake    Reviewed: Allergy & Precautions, NPO status , Patient's Chart, lab work & pertinent test results, reviewed documented beta blocker date and time   Airway Mallampati: III  TM Distance: >3 FB     Dental  (+) Chipped   Pulmonary sleep apnea ,           Cardiovascular hypertension, Pt. on medications      Neuro/Psych    GI/Hepatic GERD  Controlled,  Endo/Other    Renal/GU      Musculoskeletal   Abdominal   Peds  Hematology   Anesthesia Other Findings   Reproductive/Obstetrics                             Anesthesia Physical Anesthesia Plan  ASA: III  Anesthesia Plan: General   Post-op Pain Management:    Induction: Intravenous  Airway Management Planned: Oral ETT and LMA  Additional Equipment:   Intra-op Plan:   Post-operative Plan:   Informed Consent: I have reviewed the patients History and Physical, chart, labs and discussed the procedure including the risks, benefits and alternatives for the proposed anesthesia with the patient or authorized representative who has indicated his/her understanding and acceptance.     Plan Discussed with: CRNA  Anesthesia Plan Comments:         Anesthesia Quick Evaluation

## 2017-02-07 NOTE — H&P (Signed)
  He comes today for anal fistulotomy.  He reports no change in condition since office exam.  Discussed plan for surgery

## 2017-02-07 NOTE — Anesthesia Postprocedure Evaluation (Signed)
Anesthesia Post Note  Patient: Isaac Banks  Procedure(s) Performed: Procedure(s) (LRB): ANAL FISTULOTOMY (N/A)  Patient location during evaluation: PACU Anesthesia Type: General Level of consciousness: awake and alert Pain management: pain level controlled Vital Signs Assessment: post-procedure vital signs reviewed and stable Respiratory status: spontaneous breathing, nonlabored ventilation, respiratory function stable and patient connected to nasal cannula oxygen Cardiovascular status: blood pressure returned to baseline and stable Postop Assessment: no signs of nausea or vomiting Anesthetic complications: no     Last Vitals:  Vitals:   02/07/17 0858 02/07/17 0907  BP: 115/80 120/73  Pulse: (!) 57 (!) 49  Resp: 16   Temp: (!) 36 C     Last Pain:  Vitals:   02/07/17 0812  TempSrc:   PainSc: [place] S

## 2017-02-07 NOTE — Transfer of Care (Signed)
Immediate Anesthesia Transfer of Care Note  Patient: [place] Coll  Procedure(s) Performed: Procedure(s): ANAL FISTULOTOMY (N/A)  Patient Location: PACU  Anesthesia Type:General  Level of Consciousness: sedated  Airway & Oxygen Therapy: Patient Spontanous Breathing and Patient connected to face mask oxygen  Post-op Assessment: Report given to RN and Post -op Vital signs reviewed and stable  Post vital signs: Reviewed and stable  Last Vitals:  Vitals:   02/07/17 0634 02/07/17 0812  BP: 113/78 109/67  Pulse:  64  Resp: 20 12  Temp: 36.5 C 36.9 C    Last Pain:  Vitals:   02/07/17 0634  TempSrc: Oral         Complications: No apparent anesthesia complications

## 2017-02-07 NOTE — Op Note (Signed)
OPERATIVE REPORT  PREOPERATIVE  DIAGNOSIS: . Anal fistula  POSTOPERATIVE DIAGNOSIS: . Anal fistula  PROCEDURE: . Anal fistulotomy  ANESTHESIA:  General  SURGEON: Rochel Brome  MD   INDICATIONS: . He has history of drainage adjacent to the anal orifice. During office examination a probe was inserted through the external opening and entered the anal canal demonstrating a fistula. The external opening was posterior and to the right of midline. Fistulotomy was recommended for definitive treatment.  With the patient on the operating table in the supine position under general anesthesia the legs were elevated into the lithotomy position. The anal area was prepared with Betadine solution and draped with sterile towels and sheets  Initial inspection revealed a small opening in the skin which was posterior and slightly to the patient's right of midline approximately 1 cm from the anal orifice. 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 The anoderm was infiltrated with half percent Sensorcaine with epinephrine. The anal canal was dilated large enough to admit the index finger. There was no palpable mass. The bivalve anal retractor was introduced and further gently dilated the anal canal. Inspection revealed some normal-appearing internal hemorrhoids. The anal fistula was further demonstrated. The skin was scored with a scalpel. The fistulotomy was then carried out with electrocautery. It does go across approximately 3/4 of the internal anal sphincter. A small amount of inflammatory tissue was removed with a curet. Several small bleeding points were cauterized. Hemostasis was subsequently intact. The anal retractor was removed. Dressings were applied with paper tape.  The patient tolerated surgery satisfactorily and was then prepared for transfer to the recovery room  [facility].D.

## 2017-02-14 ENCOUNTER — Ambulatory Visit (INDEPENDENT_AMBULATORY_CARE_PROVIDER_SITE_OTHER): Payer: Federal, State, Local not specified - PPO | Admitting: Internal Medicine

## 2017-02-14 ENCOUNTER — Encounter: Payer: Self-pay | Admitting: Internal Medicine

## 2017-02-14 VITALS — BP 116/60 | HR 63 | Ht 69.0 in | Wt 206.0 lb

## 2017-02-14 DIAGNOSIS — I10 Essential (primary) hypertension: Secondary | ICD-10-CM

## 2017-02-14 DIAGNOSIS — F5101 Primary insomnia: Secondary | ICD-10-CM | POA: Insufficient documentation

## 2017-02-14 MED ORDER — ZOLPIDEM TARTRATE ER 12.5 MG PO TBCR: 12.5000 mg | EXTENDED_RELEASE_TABLET | Freq: Every evening | ORAL | 1 refills | Status: DC | PRN

## 2017-02-14 NOTE — Progress Notes (Signed)
Date:  02/14/2017   Name:  Isaac Banks   DOB:  May 24, 1961   MRN:  [id number]   Chief Complaint: Hypertension and Insomnia (Difficulty STAYING asleep at night. Always happened but seems like becoming worse. Sleeps only 2-4 hours a night. Feels restless throughout the day. ) Hypertension  This is a chronic problem. The problem is unchanged. The problem is controlled. Pertinent negatives include no chest pain, palpitations or shortness of breath.  Insomnia  Primary symptoms: sleep disturbance, frequent awakening.  The onset quality is undetermined. The problem occurs nightly. The problem has been gradually worsening since onset.  He tried [place] in the past and it helped some - he is not sure if he tried the CR formulation. He was tested for sleep apnea - was told that he had mild OSA but could not sleep with the device.  His wife says that he snores but no gasping or choking.  Review of Systems  Constitutional: Positive for fatigue. Negative for chills and fever.  Respiratory: Negative for cough, chest tightness and shortness of breath.   Cardiovascular: Negative for chest pain, palpitations and leg swelling.  Gastrointestinal: Negative for abdominal pain.  Musculoskeletal: Negative for arthralgias.  Psychiatric/Behavioral: Positive for sleep disturbance. Negative for dysphoric mood. The patient has insomnia. The patient is not nervous/anxious.     Patient Active Problem List   Diagnosis Date Noted  . Noninfectious diarrhea   . Blood in stool   . Polyp of sigmoid colon   . Rectal polyp   . Hyperlipidemia 08/09/2016  . Plantar fasciitis, right 08/08/2016  . Cardiomegaly 08/10/2015  . Benign prostatic hyperplasia with urinary obstruction 02/26/2015  . Essential (primary) hypertension 02/26/2015  . Family history of colon cancer 02/26/2015  . Neoplasm of uncertain behavior of skin 02/26/2015    Prior to Admission medications   Medication Sig Start Date End Date Taking?  Authorizing Provider  aspirin EC 81 MG tablet Take 81 mg by mouth daily as needed for mild pain.   Yes Historical Provider, MD  fluticasone (FLONASE) 50 MCG/ACT nasal spray Place 2 sprays into both nostrils every evening.   Yes Historical Provider, MD  lisinopril (PRINIVIL,ZESTRIL) 10 MG tablet Take 1 tablet (10 mg total) by mouth daily. Patient taking differently: Take 10 mg by mouth every morning.  08/08/16  Yes Glean Hess, MD  Probiotic Product (PROBIOTIC PO) Take 1 capsule by mouth 2 (two) times a week.    Yes Historical Provider, MD  tamsulosin (FLOMAX) 0.4 MG CAPS capsule Take 0.4 mg by mouth daily as needed. Reported on 04/19/2016   Yes Historical Provider, MD  VERDESO 0.05 % foam Apply 1 application topically 2 (two) times daily. 01/22/17  Yes Historical Provider, MD    Allergies  Allergen Reactions  . Codeine Rash    Past Surgical History:  Procedure Laterality Date  . ANAL FISTULOTOMY N/A 02/07/2017   Procedure: ANAL FISTULOTOMY;  Surgeon: Leonie Green, MD;  Location: [facility];  Service: General;  Laterality: N/A;  . APPENDECTOMY    . COLONOSCOPY WITH PROPOFOL N/A 08/26/2016   Procedure: COLONOSCOPY WITH PROPOFOL;  Surgeon: Lucilla Lame, MD;  Location: [place];  Service: Endoscopy;  Laterality: N/A;  sleep apnea  . HERNIA REPAIR    . POLYPECTOMY  08/26/2016   Procedure: POLYPECTOMY;  Surgeon: Lucilla Lame, MD;  Location: [place];  Service: Endoscopy;;  . 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 TONSILLECTOMY    . UMBILICAL HERNIA REPAIR  2010   in [place]  . VENTRAL HERNIA REPAIR N/A 08/11/2015   Procedure: LAPAROSCOPIC VENTRAL HERNIA WITH MESH ;  Surgeon: [name], MD;  Location: [facility];  Service: General;  Laterality: N/A;    Social History  Substance Use Topics  . Smoking status: Never Smoker  . Smokeless tobacco: Never Used  . Alcohol use 7.8 oz/week    3 Cans of beer, 10 Standard  drinks or equivalent per week     Comment: [place]     Medication list has been reviewed and updated.   Physical Exam  Constitutional: He is oriented to person, place, and time. He appears well-developed. No distress.  HENT:  Head: Normocephalic and atraumatic.  Neck: Normal range of motion. Neck supple.  Cardiovascular: Normal rate, regular rhythm and normal heart sounds.   Pulmonary/Chest: Effort normal. No respiratory distress. He has no wheezes.  Neurological: He is alert and oriented to person, place, and time.  Skin: Skin is warm and dry. No rash noted.  Psychiatric: He has a normal mood and affect. His behavior is normal. Thought content normal.  Nursing note and vitals reviewed.   BP 116/60 (BP Location: Right Arm, Patient Position: Sitting, Cuff Size: Normal)   Pulse 63   Ht 5\' 9"  (1.753 m)   Wt 206 lb (93.4 kg)   SpO2 97%   BMI 30.42 kg/m   Assessment and Plan: 1. Essential (primary) hypertension controlled  2. Primary insomnia Try Ambien Sleep hygiene measures discussed - zolpidem (AMBIEN CR) 12.5 MG CR tablet; Take 1 tablet (12.5 mg total) by mouth at bedtime as needed for sleep.  Dispense: 30 tablet; Refill: 1   Meds ordered this encounter  Medications  . zolpidem (AMBIEN CR) 12.5 MG CR tablet    Sig: Take 1 tablet (12.5 mg total) by mouth at bedtime as needed for sleep.    Dispense:  30 tablet    Refill:  [place], MD [facility]  02/14/2017

## 2017-05-05 ENCOUNTER — Telehealth: Payer: Self-pay

## 2017-05-05 NOTE — Telephone Encounter (Signed)
Spoke to pt and informed needs to be seen for OV to discuss changing sleep medication. He states he is on a extended release tablet and he feels drowsy during the day. Pt stated he will call back to schedule appt to discuss sleep meds and poss sleep apnea study.   ===View-only below this line===  ----- Message ----- From: Glean Hess, MD Sent: 05/04/2017   1:14 PM To: Alison Stalling Cox, CMA Subject: RE: At home sleep study                        An at home sleep study is to screen for sleep apnea.  He has already been tested positive for mild OSA and could not tolerate the machine.   If he wants to consider trying the CPAP again, he will need a formal sleep study at a sleep center. If he just thinks he needs a different sleeping pill, he can come see me to discuss other options.  ----- Message ----- From: Anthonette Legato, CMA Sent: 05/02/2017   4:27 PM To: Glean Hess, MD Subject: FW: At home sleep study                          ----- Message ----- From: [place] Sent: 05/02/2017  11:12 AM To: Alison Stalling Cox, CMA Subject: At home sleep study                            Pt called said he was seen back in May for sleeping issues.  Wanted to see if he could do the at home sleep study since the sleeping pills don't seem to be working.

## 2017-05-30 ENCOUNTER — Ambulatory Visit (INDEPENDENT_AMBULATORY_CARE_PROVIDER_SITE_OTHER): Payer: Federal, State, Local not specified - PPO | Admitting: Internal Medicine

## 2017-05-30 ENCOUNTER — Encounter: Payer: Self-pay | Admitting: Internal Medicine

## 2017-05-30 VITALS — BP 126/70 | HR 70 | Ht 69.0 in | Wt 209.0 lb

## 2017-05-30 DIAGNOSIS — M778 Other enthesopathies, not elsewhere classified: Secondary | ICD-10-CM

## 2017-05-30 DIAGNOSIS — M7581 Other shoulder lesions, right shoulder: Secondary | ICD-10-CM | POA: Diagnosis not present

## 2017-05-30 DIAGNOSIS — I10 Essential (primary) hypertension: Secondary | ICD-10-CM

## 2017-05-30 DIAGNOSIS — G4733 Obstructive sleep apnea (adult) (pediatric): Secondary | ICD-10-CM | POA: Diagnosis not present

## 2017-05-30 MED ORDER — MELOXICAM 15 MG PO TABS: 15.0000 mg | ORAL_TABLET | Freq: Every day | ORAL | 0 refills | Status: DC

## 2017-05-30 NOTE — Progress Notes (Signed)
Date:  05/30/2017   Name:  Isaac Banks   DOB:  16-Nov-1960   MRN:  [id number]   Chief Complaint: Shoulder Pain (Pain started a while ago but just now started bothering him again. Never had XRAY. Don't know if he slept on it wrong but feels like something is binding. ) and Insomnia (Was diagnosed in the past with OSA and this was years ago at a doctor in [place], [place]. States he would like to get CPAP machine to help with sleep at night. States he does snore. ) Shoulder Pain   The pain is present in the right shoulder. This is a new problem. The current episode started more than 1 month ago. There has been no history of extremity trauma. The problem occurs intermittently. The problem has been unchanged. The quality of the pain is described as aching. The pain is mild. Pertinent negatives include no fever, joint locking, joint swelling, numbness, stiffness or tingling. The symptoms are aggravated by activity. He has tried nothing for the symptoms.  Insomnia  Primary symptoms: fragmented sleep, sleep disturbance.  Past treatments include medication. The treatment provided no relief. PMH includes: apnea. Prior diagnostic workup includes:  Polysomnogram (dx'd with OSA but could not tolerate CPAP).  He tried Ambien but it was too sedating in the morning.  He wants to try CPAP again if he has OSA.    Review of Systems  Constitutional: Negative for chills, fatigue and fever.  Respiratory: Positive for apnea. Negative for cough, chest tightness, shortness of breath and wheezing.   Cardiovascular: Negative for chest pain.  Musculoskeletal: Positive for arthralgias and myalgias. Negative for joint swelling and stiffness.  Neurological: Negative for tingling and numbness.  Psychiatric/Behavioral: Positive for sleep disturbance. Negative for dysphoric mood. The patient has insomnia.     Patient Active Problem List   Diagnosis Date Noted  . Primary insomnia 02/14/2017  . Polyp of sigmoid colon   .  Rectal polyp   . Hyperlipidemia 08/09/2016  . Plantar fasciitis, right 08/08/2016  . Cardiomegaly 08/10/2015  . Benign prostatic hyperplasia with urinary obstruction 02/26/2015  . Essential (primary) hypertension 02/26/2015  . Family history of colon cancer 02/26/2015  . Neoplasm of uncertain behavior of skin 02/26/2015    Prior to Admission medications   Medication Sig Start Date End Date Taking? Authorizing Provider  aspirin EC 81 MG tablet Take 81 mg by mouth daily as needed for mild pain.   Yes [provider]  fluticasone (FLONASE) 50 MCG/ACT nasal spray Place 2 sprays into both nostrils every evening.   Yes [provider]  lisinopril (PRINIVIL,ZESTRIL) 10 MG tablet Take 1 tablet (10 mg total) by mouth daily. Patient taking differently: Take 10 mg by mouth every morning.  08/08/16  Yes Glean Hess, MD  Probiotic Product (PROBIOTIC PO) Take 1 capsule by mouth 2 (two) times a week.    Yes [provider]  tamsulosin (FLOMAX) 0.4 MG CAPS capsule Take 0.4 mg by mouth daily as needed. Reported on 04/19/2016   Yes [provider]  VERDESO 0.05 % foam Apply 1 application topically 2 (two) times daily. 01/22/17  Yes [provider]  zolpidem (AMBIEN CR) 12.5 MG CR tablet Take 1 tablet (12.5 mg total) by mouth at bedtime as needed for sleep. Patient not taking: Reported on 05/30/2017 02/14/17   Glean Hess, MD    Allergies  Allergen Reactions  . Codeine Rash    Past Surgical History:  Procedure Laterality Date  .  ANAL FISTULOTOMY N/A 02/07/2017   Procedure: ANAL FISTULOTOMY;  Surgeon: Leonie Green, MD;  Location: [facility];  Service: General;  Laterality: N/A;  . APPENDECTOMY    . COLONOSCOPY WITH PROPOFOL N/A 08/26/2016   Procedure: COLONOSCOPY WITH PROPOFOL;  Surgeon: Lucilla Lame, MD;  Location: [place];  Service: Endoscopy;  Laterality: N/A;  sleep apnea  . HERNIA REPAIR    . POLYPECTOMY  08/26/2016   Procedure:  POLYPECTOMY;  Surgeon: Lucilla Lame, MD;  Location: [place];  Service: Endoscopy;;  . 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 TONSILLECTOMY    . UMBILICAL HERNIA REPAIR  2010   in [place]  . VENTRAL HERNIA REPAIR N/A 08/11/2015   Procedure: LAPAROSCOPIC VENTRAL HERNIA WITH MESH ;  Surgeon: [name], MD;  Location: [facility];  Service: General;  Laterality: N/A;    Social History  Substance Use Topics  . Smoking status: Never Smoker  . Smokeless tobacco: Never Used  . Alcohol use 7.8 oz/week    3 Cans of beer, 10 Standard drinks or equivalent per week     Comment: [place]     Medication list has been reviewed and updated.   Physical Exam  Constitutional: He is oriented to person, place, and time. He appears well-developed. No distress.  HENT:  Head: Normocephalic and atraumatic.  Neck: Normal range of motion. Neck supple.  Cardiovascular: Normal rate, regular rhythm and normal heart sounds.   Pulmonary/Chest: Effort normal and breath sounds normal. No respiratory distress. He has no wheezes.  Musculoskeletal:       Right shoulder: He exhibits decreased range of motion (to abduction) and tenderness.  Neurological: He is alert and oriented to person, place, and time.  Skin: Skin is warm and dry. No rash noted.  Psychiatric: He has a normal mood and affect. His behavior is normal. Thought content normal.  Nursing note and vitals reviewed.   BP 126/70   Pulse 70   Ht 5\' 9"  (1.753 m)   Wt 209 lb (94.8 kg)   SpO2 96%   BMI 30.86 kg/m   Assessment and Plan: 1. OSA (obstructive sleep apnea) Epworth sleep scale = 12 - Split night study; Future  2. Shoulder tendonitis, right If not response to meloxicam, will refer to Ortho - meloxicam (MOBIC) 15 MG tablet; Take 1 tablet (15 mg total) by mouth daily.  Dispense: 30 tablet; Refill: 0  3. Essential (primary) hypertension controlled   Meds ordered this encounter    Medications  . meloxicam (MOBIC) 15 MG tablet    Sig: Take 1 tablet (15 mg total) by mouth daily.    Dispense:  30 tablet    Refill:  0    Halina Maidens, MD [facility]  05/30/2017

## 2017-07-14 ENCOUNTER — Ambulatory Visit (INDEPENDENT_AMBULATORY_CARE_PROVIDER_SITE_OTHER): Payer: Federal, State, Local not specified - PPO | Admitting: Internal Medicine

## 2017-07-14 ENCOUNTER — Encounter: Payer: Self-pay | Admitting: Internal Medicine

## 2017-07-14 VITALS — BP 108/62 | HR 73 | Ht 69.0 in | Wt 210.0 lb

## 2017-07-14 DIAGNOSIS — G4733 Obstructive sleep apnea (adult) (pediatric): Secondary | ICD-10-CM

## 2017-07-14 DIAGNOSIS — M25511 Pain in right shoulder: Secondary | ICD-10-CM

## 2017-07-14 DIAGNOSIS — G8929 Other chronic pain: Secondary | ICD-10-CM | POA: Diagnosis not present

## 2017-07-14 DIAGNOSIS — I10 Essential (primary) hypertension: Secondary | ICD-10-CM

## 2017-07-14 MED ORDER — LISINOPRIL 10 MG PO TABS: 10.0000 mg | ORAL_TABLET | ORAL | 1 refills | Status: DC

## 2017-07-14 NOTE — Progress Notes (Signed)
Date:  07/14/2017   Name:  Isaac Banks   DOB:  November 06, 1960   MRN:  [id number]   Chief Complaint: Shoulder Pain (Right Shoulder- Still continuing to hurt. Never got any better ) and Sleep Apnea (Received sleep study results- told him his copay he would need to pay. Asked for a list of supplies they do for this copay and they could not give him a list. )  Shoulder Pain   This is a chronic problem. The current episode started more than 1 month ago. There has been no history of extremity trauma. Pertinent negatives include no fever or numbness. He has tried NSAIDS for the symptoms. The treatment provided no relief.  Hypertension  This is a chronic problem. The problem is unchanged. The problem is controlled. Pertinent negatives include no chest pain or shortness of breath.   OSA - sleep test was positive and BiPAP 17/13 was ordered.  He did not get supplies because they could not give him a list of what was included in the co-pay.    Review of Systems  Constitutional: Negative for chills, fatigue and fever.  Respiratory: Positive for apnea. Negative for cough, chest tightness, shortness of breath and wheezing.   Cardiovascular: Negative for chest pain.  Musculoskeletal: Positive for arthralgias and myalgias. Negative for joint swelling.  Neurological: Negative for numbness.  Psychiatric/Behavioral: Positive for sleep disturbance. Negative for dysphoric mood.    Patient Active Problem List   Diagnosis Date Noted  . OSA (obstructive sleep apnea) 05/30/2017  . Primary insomnia 02/14/2017  . Polyp of sigmoid colon   . Rectal polyp   . Hyperlipidemia 08/09/2016  . Plantar fasciitis, right 08/08/2016  . Cardiomegaly 08/10/2015  . Benign prostatic hyperplasia with urinary obstruction 02/26/2015  . Essential (primary) hypertension 02/26/2015  . Family history of colon cancer 02/26/2015  . Neoplasm of uncertain behavior of skin 02/26/2015    Prior to Admission medications   Medication  Sig Start Date End Date Taking? Authorizing Provider  aspirin EC 81 MG tablet Take 81 mg by mouth daily as needed for mild pain.    [provider]  fluticasone (FLONASE) 50 MCG/ACT nasal spray Place 2 sprays into both nostrils every evening.    [provider]  lisinopril (PRINIVIL,ZESTRIL) 10 MG tablet Take 1 tablet (10 mg total) by mouth daily. Patient taking differently: Take 10 mg by mouth every morning.  08/08/16   Glean Hess, MD  meloxicam (MOBIC) 15 MG tablet Take 1 tablet (15 mg total) by mouth daily. 05/30/17   Glean Hess, MD  Probiotic Product (PROBIOTIC PO) Take 1 capsule by mouth 2 (two) times a week.     [provider]  tamsulosin (FLOMAX) 0.4 MG CAPS capsule Take 0.4 mg by mouth daily as needed. Reported on 04/19/2016    [provider]  VERDESO 0.05 % foam Apply 1 application topically 2 (two) times daily. 01/22/17   [provider]    Allergies  Allergen Reactions  . Codeine Rash    Past Surgical History:  Procedure Laterality Date  . ANAL FISTULOTOMY N/A 02/07/2017   Procedure: ANAL FISTULOTOMY;  Surgeon: Leonie Green, MD;  Location: [facility];  Service: General;  Laterality: N/A;  . APPENDECTOMY    . COLONOSCOPY WITH PROPOFOL N/A 08/26/2016   Procedure: COLONOSCOPY WITH PROPOFOL;  Surgeon: Lucilla Lame, MD;  Location: [facility];  Service: Endoscopy;  Laterality: N/A;  sleep apnea  . HERNIA REPAIR    .  POLYPECTOMY  08/26/2016   Procedure: POLYPECTOMY;  Surgeon: Lucilla Lame, MD;  Location: [facility];  Service: Endoscopy;;  . 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 TONSILLECTOMY    . UMBILICAL HERNIA REPAIR  2010   in [place]  . VENTRAL HERNIA REPAIR N/A 08/11/2015   Procedure: LAPAROSCOPIC VENTRAL HERNIA WITH MESH ;  Surgeon: [name], MD;  Location: [facility];  Service: General;  Laterality: N/A;    Social History  Substance Use Topics  .  Smoking status: Never Smoker  . Smokeless tobacco: Never Used  . Alcohol use 7.8 oz/week    3 Cans of beer, 10 Standard drinks or equivalent per week     Comment: [place]     Medication list has been reviewed and updated.  PHQ 2/9 Scores 05/30/2017 04/19/2016 12/28/2015  PHQ - 2 Score 0 0 0    Physical Exam  Constitutional: He is oriented to person, place, and time. He appears well-developed. No distress.  HENT:  Head: Normocephalic and atraumatic.  Neck: Normal range of motion. Neck supple. No thyromegaly present.  Cardiovascular: Normal rate, regular rhythm and normal heart sounds.   Pulmonary/Chest: Effort normal and breath sounds normal. No respiratory distress. He has no wheezes.  Musculoskeletal:       Right shoulder: He exhibits decreased range of motion and tenderness. He exhibits no swelling and no effusion.  Neurological: He is alert and oriented to person, place, and time. He has normal strength.  Skin: Skin is warm and dry. No rash noted.  Psychiatric: He has a normal mood and affect. His behavior is normal. Thought content normal.  Nursing note and vitals reviewed.   BP 108/62   Pulse 73   Ht 5\' 9"  (1.753 m)   Wt 210 lb (95.3 kg)   SpO2 97%   BMI 31.01 kg/m   Assessment and Plan: 1. OSA (obstructive sleep apnea) Pt given Rx for equipment - to research and begin treatment  2. Chronic right shoulder pain Need to see Ortho - Ambulatory referral to Orthopedic Surgery  3. Essential (primary) hypertension - lisinopril (PRINIVIL,ZESTRIL) 10 MG tablet; Take 1 tablet (10 mg total) by mouth every morning.  Dispense: 90 tablet; Refill: 1   Meds ordered this encounter  Medications  . lisinopril (PRINIVIL,ZESTRIL) 10 MG tablet    Sig: Take 1 tablet (10 mg total) by mouth every morning.    Dispense:  90 tablet    Refill:  1    Partially dictated using Editor, commissioning. Any errors are unintentional.  Halina Maidens, MD [facility]  07/14/2017

## 2017-07-14 NOTE — Patient Instructions (Signed)
[facility] Therapy Map & Directions  (1 Review)  [place], [place], [place] [address] [phone]

## 2017-08-11 ENCOUNTER — Encounter: Payer: BLUE CROSS/BLUE SHIELD | Admitting: Internal Medicine

## 2017-12-20 ENCOUNTER — Other Ambulatory Visit: Payer: Self-pay | Admitting: Internal Medicine

## 2017-12-20 DIAGNOSIS — I10 Essential (primary) hypertension: Secondary | ICD-10-CM

## 2018-01-14 ENCOUNTER — Encounter: Payer: Self-pay | Admitting: Internal Medicine

## 2018-01-14 ENCOUNTER — Ambulatory Visit: Payer: Federal, State, Local not specified - PPO | Admitting: Internal Medicine

## 2018-01-14 VITALS — BP 124/78 | HR 73 | Ht 69.0 in | Wt 203.0 lb

## 2018-01-14 DIAGNOSIS — B3742 Candidal balanitis: Secondary | ICD-10-CM

## 2018-01-14 DIAGNOSIS — I10 Essential (primary) hypertension: Secondary | ICD-10-CM

## 2018-01-14 DIAGNOSIS — G4733 Obstructive sleep apnea (adult) (pediatric): Secondary | ICD-10-CM | POA: Diagnosis not present

## 2018-01-14 DIAGNOSIS — E782 Mixed hyperlipidemia: Secondary | ICD-10-CM | POA: Diagnosis not present

## 2018-01-14 MED ORDER — ZALEPLON 10 MG PO CAPS: 10.0000 mg | ORAL_CAPSULE | Freq: Every evening | ORAL | 5 refills | Status: DC | PRN

## 2018-01-14 MED ORDER — FLUCONAZOLE 100 MG PO TABS: 100.0000 mg | ORAL_TABLET | Freq: Once | ORAL | 0 refills | Status: AC

## 2018-01-14 MED ORDER — LISINOPRIL 10 MG PO TABS: 10.0000 mg | ORAL_TABLET | ORAL | 1 refills | Status: DC

## 2018-01-14 NOTE — Progress Notes (Signed)
Date:  01/14/2018   Name:  Isaac Banks   DOB:  1961/01/29   MRN:  [id number]   Chief Complaint: Hyperlipidemia; Insomnia (Happening for more than 6 months. Can't stay asleep. Using bipap. ); and Hypertension  Insomnia  Primary symptoms: fragmented sleep, sleep disturbance, frequent awakening.  The current episode started more than one year. The onset quality is undetermined. The problem occurs every several days. Prior diagnostic workup includes:  Polysomnogram (now on BiPap for OSA).  Hyperlipidemia  This is a chronic problem. Pertinent negatives include no chest pain or shortness of breath. Current antihyperlipidemic treatment includes diet change (working on [employer] and lost 15 lbs).   OSA - on Bipap - using it about 5 hours per night but sometimes not at all.  It wakes his up and he can not go back to sleep.    Balanitis - he complains of yeast under his foreskin that always starts when the weather gets warm.  He has used nothing for this.  He is not circumcised - has no phimosis or stricture.    Review of Systems  Constitutional: Negative for chills, fatigue and fever.  Eyes: Negative for visual disturbance.  Respiratory: Negative for cough, chest tightness, shortness of breath and wheezing.   Cardiovascular: Negative for chest pain, palpitations and leg swelling.  Gastrointestinal: Negative for abdominal pain.  Genitourinary: Positive for genital sores. Negative for discharge and penile pain.  Musculoskeletal: Negative for arthralgias and gait problem.  Skin: Positive for rash.  Neurological: Negative for dizziness and headaches.  Psychiatric/Behavioral: Positive for sleep disturbance. The patient has insomnia.     Patient Active Problem List   Diagnosis Date Noted  . OSA (obstructive sleep apnea) 05/30/2017  . Primary insomnia 02/14/2017  . Polyp of sigmoid colon   . Rectal polyp   . Hyperlipidemia 08/09/2016  . Plantar fasciitis, right 08/08/2016  . Cardiomegaly  08/10/2015  . Benign prostatic hyperplasia with urinary obstruction 02/26/2015  . Essential (primary) hypertension 02/26/2015  . Family history of colon cancer 02/26/2015  . Neoplasm of uncertain behavior of skin 02/26/2015    Prior to Admission medications   Medication Sig Start Date End Date Taking? Authorizing Provider  aspirin EC 81 MG tablet Take 81 mg by mouth daily as needed for mild pain.   Yes [provider]  fluticasone (FLONASE) 50 MCG/ACT nasal spray Place 2 sprays into both nostrils every evening.   Yes [provider]  lisinopril (PRINIVIL,ZESTRIL) 10 MG tablet Take 1 tablet (10 mg total) by mouth every morning. 07/14/17  Yes Glean Hess, MD  meloxicam (MOBIC) 15 MG tablet Take 1 tablet (15 mg total) by mouth daily. 05/30/17  Yes Glean Hess, MD  Probiotic Product (PROBIOTIC PO) Take 1 capsule by mouth 2 (two) times a week.    Yes [provider]  tamsulosin (FLOMAX) 0.4 MG CAPS capsule Take 0.4 mg by mouth daily as needed. Reported on 04/19/2016   Yes [provider]    Allergies  Allergen Reactions  . Codeine Rash    Past Surgical History:  Procedure Laterality Date  . ANAL FISTULOTOMY N/A 02/07/2017   Procedure: ANAL FISTULOTOMY;  Surgeon: Leonie Green, MD;  Location: [facility];  Service: General;  Laterality: N/A;  . APPENDECTOMY    . COLONOSCOPY WITH PROPOFOL N/A 08/26/2016   Procedure: COLONOSCOPY WITH PROPOFOL;  Surgeon: Lucilla Lame, MD;  Location: [place];  Service: Endoscopy;  Laterality: N/A;  sleep apnea  .  HERNIA REPAIR    . POLYPECTOMY  08/26/2016   Procedure: POLYPECTOMY;  Surgeon: Lucilla Lame, MD;  Location: [place];  Service: Endoscopy;;  . 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 TONSILLECTOMY    . UMBILICAL HERNIA REPAIR  2010   in [place]  . VENTRAL HERNIA REPAIR N/A 08/11/2015   Procedure: LAPAROSCOPIC VENTRAL HERNIA WITH MESH ;   Surgeon: [name], MD;  Location: [facility];  Service: General;  Laterality: N/A;    Social History   Tobacco Use  . Smoking status: Never Smoker  . Smokeless tobacco: Never Used  Substance Use Topics  . Alcohol use: Yes    Alcohol/week: 7.8 oz    Types: 3 Cans of beer, 10 Standard drinks or equivalent per week    Comment: OCC  . Drug use: No     Medication list has been reviewed and updated.  PHQ 2/9 Scores 05/30/2017 04/19/2016 12/28/2015  PHQ - 2 Score 0 0 0    Physical Exam  Constitutional: He is oriented to person, place, and time. He appears well-developed. No distress.  HENT:  Head: Normocephalic and atraumatic.  Neck: Normal range of motion. Neck supple.  Cardiovascular: Normal rate, regular rhythm and normal heart sounds.  Pulmonary/Chest: Effort normal. No respiratory distress.  Abdominal: Soft. Bowel sounds are normal. He exhibits no distension. There is no tenderness.  Genitourinary:  Genitourinary Comments: Pt declines GU exam  Musculoskeletal: Normal range of motion.  Neurological: He is alert and oriented to person, place, and time.  Skin: Skin is warm and dry. No rash noted.  Psychiatric: He has a normal mood and affect. His behavior is normal. Thought content normal.  Nursing note and vitals reviewed.   BP 124/78   Pulse 73   Ht 5\' 9"  (1.753 m)   Wt 203 lb (92.1 kg)   SpO2 96%   BMI 29.98 kg/m   Assessment and Plan: 1. Essential (primary) hypertension controlled - CBC with Differential/Platelet - Comprehensive metabolic panel - lisinopril (PRINIVIL,ZESTRIL) 10 MG tablet; Take 1 tablet (10 mg total) by mouth every morning.  Dispense: 90 tablet; Refill: 1  2. OSA (obstructive sleep apnea) Continue to use device nightly with goal to use it for 4 hours - zaleplon (SONATA) 10 MG capsule; Take 1 capsule (10 mg total) by mouth at bedtime as needed for sleep.  Dispense: 30 capsule; Refill: 5  3. Mixed hyperlipidemia Check labs now after weight  loss - Lipid panel  4. Candidal balanitis Diflucan 100 mg once PRN - fluconazole (DIFLUCAN) 100 MG tablet; Take 1 tablet (100 mg total) by mouth once for 1 dose.  Dispense: 7 tablet; Refill: 0   Meds ordered this encounter  Medications  . lisinopril (PRINIVIL,ZESTRIL) 10 MG tablet    Sig: Take 1 tablet (10 mg total) by mouth every morning.    Dispense:  90 tablet    Refill:  1  . zaleplon (SONATA) 10 MG capsule    Sig: Take 1 capsule (10 mg total) by mouth at bedtime as needed for sleep.    Dispense:  30 capsule    Refill:  5  . fluconazole (DIFLUCAN) 100 MG tablet    Sig: Take 1 tablet (100 mg total) by mouth once for 1 dose.    Dispense:  7 tablet    Refill:  0    Partially dictated using Editor, commissioning. Any errors are unintentional.  Halina Maidens,  MD [facility]  01/14/2018

## 2018-01-15 LAB — CBC WITH DIFFERENTIAL/PLATELET
BASOS: 1 %
Basophils Absolute: 0.1 10*3/uL (ref 0.0–0.2)
EOS (ABSOLUTE): 0.3 10*3/uL (ref 0.0–0.4)
EOS: 3 %
HEMATOCRIT: 43.7 % (ref 37.5–51.0)
HEMOGLOBIN: 15 g/dL (ref 13.0–17.7)
IMMATURE GRANS (ABS): 0 10*3/uL (ref 0.0–0.1)
IMMATURE GRANULOCYTES: 0 %
LYMPHS: 29 %
Lymphocytes Absolute: 2.7 10*3/uL (ref 0.7–3.1)
MCH: 29.5 pg (ref 26.6–33.0)
MCHC: 34.3 g/dL (ref 31.5–35.7)
MCV: 86 fL (ref 79–97)
Monocytes Absolute: 0.6 10*3/uL (ref 0.1–0.9)
Monocytes: 6 %
NEUTROS PCT: 61 %
Neutrophils Absolute: 6 10*3/uL (ref 1.4–7.0)
PLATELETS: 268 10*3/uL (ref 150–379)
RBC: 5.09 x10E6/uL (ref 4.14–5.80)
RDW: 13.2 % (ref 12.3–15.4)
WBC: 9.6 10*3/uL (ref 3.4–10.8)

## 2018-01-15 LAB — LIPID PANEL
CHOLESTEROL TOTAL: 215 mg/dL — AB (ref 100–199)
Chol/HDL Ratio: 3.5 ratio (ref 0.0–5.0)
HDL: 62 mg/dL (ref >39)
LDL Calculated: 140 mg/dL — ABNORMAL HIGH (ref 0–99)
TRIGLYCERIDES: 64 mg/dL (ref 0–149)
VLDL Cholesterol Cal: 13 mg/dL (ref 5–40)

## 2018-01-15 LAB — COMPREHENSIVE METABOLIC PANEL
A/G RATIO: 2.4 — AB (ref 1.2–2.2)
ALBUMIN: 4.6 g/dL (ref 3.5–5.5)
ALT: 13 IU/L (ref 0–44)
AST: 12 IU/L (ref 0–40)
Alkaline Phosphatase: 65 IU/L (ref 39–117)
BUN/Creatinine Ratio: 13 (ref 9–20)
BUN: 12 mg/dL (ref 6–24)
Bilirubin Total: 1 mg/dL (ref 0.0–1.2)
CALCIUM: 9.4 mg/dL (ref 8.7–10.2)
CHLORIDE: 104 mmol/L (ref 96–106)
CO2: 22 mmol/L (ref 20–29)
Creatinine, Ser: 0.94 mg/dL (ref 0.76–1.27)
GFR calc non Af Amer: 92 mL/min/{1.73_m2} (ref >59)
GFR, EST AFRICAN AMERICAN: 106 mL/min/{1.73_m2} (ref >59)
Globulin, Total: 1.9 g/dL (ref 1.5–4.5)
Glucose: 82 mg/dL (ref 65–99)
POTASSIUM: 4.3 mmol/L (ref 3.5–5.2)
Sodium: 142 mmol/L (ref 134–144)
Total Protein: 6.5 g/dL (ref 6.0–8.5)

## 2018-03-03 ENCOUNTER — Encounter: Payer: Federal, State, Local not specified - PPO | Admitting: Internal Medicine

## 2018-05-11 ENCOUNTER — Other Ambulatory Visit: Payer: Self-pay | Admitting: Internal Medicine

## 2018-05-11 DIAGNOSIS — F5101 Primary insomnia: Secondary | ICD-10-CM

## 2018-05-11 MED ORDER — TEMAZEPAM 15 MG PO CAPS: 15.0000 mg | ORAL_CAPSULE | Freq: Every evening | ORAL | 0 refills | Status: DC | PRN

## 2018-06-19 ENCOUNTER — Encounter: Payer: Self-pay | Admitting: Internal Medicine

## 2018-06-19 ENCOUNTER — Ambulatory Visit
Admission: RE | Admit: 2018-06-19 | Discharge: 2018-06-19 | Disposition: A | Discharge status: Home / Self Care | Payer: Federal, State, Local not specified - PPO | Source: Ambulatory Visit | Attending: Internal Medicine | Admitting: Internal Medicine

## 2018-06-19 ENCOUNTER — Ambulatory Visit: Payer: Federal, State, Local not specified - PPO | Admitting: Internal Medicine

## 2018-06-19 VITALS — BP 110/80 | HR 60 | Ht 69.0 in | Wt 215.0 lb

## 2018-06-19 DIAGNOSIS — M545 Low back pain: Secondary | ICD-10-CM | POA: Insufficient documentation

## 2018-06-19 DIAGNOSIS — F5101 Primary insomnia: Secondary | ICD-10-CM | POA: Diagnosis not present

## 2018-06-19 DIAGNOSIS — M47816 Spondylosis without myelopathy or radiculopathy, lumbar region: Secondary | ICD-10-CM | POA: Diagnosis not present

## 2018-06-19 DIAGNOSIS — G8929 Other chronic pain: Secondary | ICD-10-CM | POA: Insufficient documentation

## 2018-06-19 DIAGNOSIS — R21 Rash and other nonspecific skin eruption: Secondary | ICD-10-CM | POA: Diagnosis not present

## 2018-06-19 DIAGNOSIS — I708 Atherosclerosis of other arteries: Secondary | ICD-10-CM | POA: Insufficient documentation

## 2018-06-19 MED ORDER — MELOXICAM 15 MG PO TABS: 15.0000 mg | ORAL_TABLET | Freq: Every day | ORAL | 5 refills | Status: DC

## 2018-06-19 NOTE — Patient Instructions (Signed)
Use Lamisil cream on chest rash  Try Belsomra for sleep - take it right before bed

## 2018-06-19 NOTE — Progress Notes (Signed)
Date:  06/19/2018   Name:  Isaac Banks   DOB:  14-Jun-1961   MRN:  [id number]   Chief Complaint: Back Pain (tried otc advil and ibuprofen) Back Pain  This is a chronic problem. The current episode started more than 1 month ago. The problem occurs daily. The problem has been waxing and waning since onset. The pain is present in the lumbar spine. The pain does not radiate. The pain is moderate. The symptoms are aggravated by bending and standing. Pertinent negatives include no abdominal pain or chest pain. He has tried NSAIDs for the symptoms. The treatment provided mild relief.  Insomnia  Primary symptoms: no sleep disturbance, premature morning awakening.  Treatments tried: ambien CR, sonata, temazepam.  Rash  This is a new problem. The current episode started 1 to 4 weeks ago. The problem is unchanged. The affected locations include the chest. The rash is characterized by redness and itchiness. He was exposed to nothing. Pertinent negatives include no diarrhea, fatigue or shortness of breath. Past treatments include topical steroids. The treatment provided no relief.      Review of Systems  Constitutional: Negative for chills, fatigue and unexpected weight change.  Respiratory: Negative for shortness of breath.   Cardiovascular: Negative for chest pain and palpitations.  Gastrointestinal: Negative for abdominal pain, constipation and diarrhea.  Genitourinary: Negative for difficulty urinating.  Musculoskeletal: Positive for back pain. Negative for joint swelling and myalgias.  Skin: Positive for rash.  Psychiatric/Behavioral: Negative for dysphoric mood and sleep disturbance. The patient has insomnia.     Patient Active Problem List   Diagnosis Date Noted  . OSA (obstructive sleep apnea) 05/30/2017  . Primary insomnia 02/14/2017  . Polyp of sigmoid colon   . Rectal polyp   . Hyperlipidemia 08/09/2016  . Plantar fasciitis, right 08/08/2016  . Cardiomegaly 08/10/2015  . Benign  prostatic hyperplasia with urinary obstruction 02/26/2015  . Essential (primary) hypertension 02/26/2015  . Family history of colon cancer 02/26/2015  . Neoplasm of uncertain behavior of skin 02/26/2015    Allergies  Allergen Reactions  . Codeine Rash    Past Surgical History:  Procedure Laterality Date  . ANAL FISTULOTOMY N/A 02/07/2017   Procedure: ANAL FISTULOTOMY;  Surgeon: Leonie Green, MD;  Location: [facility];  Service: General;  Laterality: N/A;  . APPENDECTOMY    . COLONOSCOPY WITH PROPOFOL N/A 08/26/2016   Procedure: COLONOSCOPY WITH PROPOFOL;  Surgeon: Lucilla Lame, MD;  Location: [place];  Service: Endoscopy;  Laterality: N/A;  sleep apnea  . HERNIA REPAIR    . POLYPECTOMY  08/26/2016   Procedure: POLYPECTOMY;  Surgeon: Lucilla Lame, MD;  Location: [place];  Service: Endoscopy;;  . 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 TONSILLECTOMY    . UMBILICAL HERNIA REPAIR  2010   in [place]  . VENTRAL HERNIA REPAIR N/A 08/11/2015   Procedure: LAPAROSCOPIC VENTRAL HERNIA WITH MESH ;  Surgeon: [name], MD;  Location: [facility];  Service: General;  Laterality: N/A;    Social History   Tobacco Use  . Smoking status: Never Smoker  . Smokeless tobacco: Never Used  Substance Use Topics  . Alcohol use: Yes    Alcohol/week: 13.0 standard drinks    Types: 3 Cans of beer, 10 Standard drinks or equivalent per week    Comment: OCC  . Drug use: No     Medication list has been reviewed and  updated.  Current Meds  Medication Sig  . aspirin EC 81 MG tablet Take 81 mg by mouth daily as needed for mild pain.  . fluticasone (FLONASE) 50 MCG/ACT nasal spray Place 2 sprays into both nostrils every evening.  Marland Kitchen lisinopril (PRINIVIL,ZESTRIL) 10 MG tablet Take 1 tablet (10 mg total) by mouth every morning.  . meloxicam (MOBIC) 15 MG tablet Take 1 tablet (15 mg total) by mouth daily.  . Probiotic Product (PROBIOTIC  PO) Take 1 capsule by mouth 2 (two) times a week.   . tamsulosin (FLOMAX) 0.4 MG CAPS capsule Take 0.4 mg by mouth daily as needed. Reported on 04/19/2016  . temazepam (RESTORIL) 15 MG capsule Take 1 capsule (15 mg total) by mouth at bedtime as needed for sleep.  . [DISCONTINUED] meloxicam (MOBIC) 15 MG tablet Take 1 tablet (15 mg total) by mouth daily.    PHQ 2/9 Scores 06/19/2018 05/30/2017 04/19/2016 12/28/2015  PHQ - 2 Score 0 0 0 0  PHQ- 9 Score 3 - - -    Physical Exam  Constitutional: He is oriented to person, place, and time. He appears well-developed. No distress.  HENT:  Head: Normocephalic and atraumatic.  Pulmonary/Chest: Effort normal. No respiratory distress.  Musculoskeletal: Normal range of motion.       Arms: Neurological: He is alert and oriented to person, place, and time. He has normal reflexes.  SLR negative  Skin: Skin is warm and dry. Rash noted.     Psychiatric: He has a normal mood and affect. His speech is normal and behavior is normal. Thought content normal.  Nursing note and vitals reviewed.   BP 110/80   Pulse 60   Ht 5\' 9"  (1.753 m)   Wt 215 lb (97.5 kg)   BMI 31.75 kg/m   Assessment and Plan: 1. Chronic left-sided low back pain without sciatica Will refer to PTx if xray unremarkable - DG Lumbar Spine Complete; Future - meloxicam (MOBIC) 15 MG tablet; Take 1 tablet (15 mg total) by mouth daily.  Dispense: 30 tablet; Refill: 5  2. Primary insomnia Samples of Belsomra 15 mg given  3. Rash Recommend otc Lamisil   Meds ordered this encounter  Medications  . meloxicam (MOBIC) 15 MG tablet    Sig: Take 1 tablet (15 mg total) by mouth daily.    Dispense:  30 tablet    Refill:  5    Partially dictated using Editor, commissioning. Any errors are unintentional.  Halina Maidens, MD [facility]  06/19/2018   There are no diagnoses linked to this encounter.

## 2018-06-22 ENCOUNTER — Other Ambulatory Visit: Payer: Self-pay | Admitting: Internal Medicine

## 2018-06-22 MED ORDER — ATORVASTATIN CALCIUM 10 MG PO TABS: 10.0000 mg | ORAL_TABLET | Freq: Every day | ORAL | 1 refills | Status: DC

## 2018-06-22 NOTE — Progress Notes (Signed)
Patient would like to try statin medication. Will call if any side effects or issues. Pharmacy [facility] [place].

## 2018-06-26 ENCOUNTER — Ambulatory Visit (INDEPENDENT_AMBULATORY_CARE_PROVIDER_SITE_OTHER): Payer: Federal, State, Local not specified - PPO | Admitting: Internal Medicine

## 2018-06-26 ENCOUNTER — Encounter: Payer: Self-pay | Admitting: Internal Medicine

## 2018-06-26 VITALS — BP 104/68 | HR 55 | Ht 69.0 in | Wt 210.0 lb

## 2018-06-26 DIAGNOSIS — I10 Essential (primary) hypertension: Secondary | ICD-10-CM

## 2018-06-26 DIAGNOSIS — E782 Mixed hyperlipidemia: Secondary | ICD-10-CM | POA: Diagnosis not present

## 2018-06-26 DIAGNOSIS — Z0000 Encounter for general adult medical examination without abnormal findings: Secondary | ICD-10-CM

## 2018-06-26 DIAGNOSIS — F5101 Primary insomnia: Secondary | ICD-10-CM

## 2018-06-26 DIAGNOSIS — Z125 Encounter for screening for malignant neoplasm of prostate: Secondary | ICD-10-CM | POA: Diagnosis not present

## 2018-06-26 DIAGNOSIS — I2510 Atherosclerotic heart disease of native coronary artery without angina pectoris: Secondary | ICD-10-CM

## 2018-06-26 LAB — POCT URINALYSIS DIPSTICK
Bilirubin, UA: NEGATIVE
Blood, UA: NEGATIVE
GLUCOSE UA: NEGATIVE
LEUKOCYTES UA: NEGATIVE
NITRITE UA: NEGATIVE
PROTEIN UA: NEGATIVE
SPEC GRAV UA: 1.01 (ref 1.010–1.025)
Urobilinogen, UA: 0.2 E.U./dL
pH, UA: 7 (ref 5.0–8.0)

## 2018-06-26 MED ORDER — LISINOPRIL 10 MG PO TABS: 10.0000 mg | ORAL_TABLET | ORAL | 1 refills | Status: DC

## 2018-06-26 NOTE — Progress Notes (Signed)
Date:  06/26/2018   Name:  Isaac Banks   DOB:  02-11-1961   MRN:  [id number]   Chief Complaint: Annual Exam Isaac Banks is a 57 y.o. male who presents today for his Complete Annual Exam. He feels fairly well. He reports exercising some. He reports he is sleeping fairly well on Belsomra.   Insomnia  Primary symptoms: no sleep disturbance.  The problem occurs nightly. The problem has been gradually improving since onset.  Back Pain  This is a new problem. The problem has been gradually improving since onset. The pain is present in the lumbar spine. Pertinent negatives include no abdominal pain, chest pain, dysuria or headaches. He has tried NSAIDs for the symptoms. The treatment provided moderate relief.  Hyperlipidemia  This is a chronic problem. Pertinent negatives include no chest pain, myalgias or shortness of breath. Current antihyperlipidemic treatment includes statins (just started).   Atherosclerosis of aorta and iliac - noted on lumbar films.  Started lipitor, probably needs cardiac evaluation.  Review of Systems  Constitutional: Negative for appetite change, chills, diaphoresis, fatigue and unexpected weight change.  HENT: Negative for hearing loss, tinnitus, trouble swallowing and voice change.   Eyes: Negative for visual disturbance.  Respiratory: Negative for choking, shortness of breath and wheezing.   Cardiovascular: Negative for chest pain, palpitations and leg swelling.  Gastrointestinal: Negative for abdominal pain, blood in stool, constipation and diarrhea.  Genitourinary: Negative for difficulty urinating, dysuria and frequency.  Musculoskeletal: Positive for back pain. Negative for arthralgias and myalgias.  Skin: Negative for color change and rash.  Neurological: Negative for dizziness, syncope and headaches.  Hematological: Negative for adenopathy.  Psychiatric/Behavioral: Negative for dysphoric mood and sleep disturbance. The patient has insomnia.      Patient Active Problem List   Diagnosis Date Noted  . OSA (obstructive sleep apnea) 05/30/2017  . Primary insomnia 02/14/2017  . Polyp of sigmoid colon   . Rectal polyp   . Hyperlipidemia 08/09/2016  . Plantar fasciitis, right 08/08/2016  . Cardiomegaly 08/10/2015  . Benign prostatic hyperplasia with urinary obstruction 02/26/2015  . Essential (primary) hypertension 02/26/2015  . Family history of colon cancer 02/26/2015  . Neoplasm of uncertain behavior of skin 02/26/2015    Allergies  Allergen Reactions  . Codeine Rash    Past Surgical History:  Procedure Laterality Date  . ANAL FISTULOTOMY N/A 02/07/2017   Procedure: ANAL FISTULOTOMY;  Surgeon: Leonie Green, MD;  Location: [facility];  Service: General;  Laterality: N/A;  . APPENDECTOMY    . COLONOSCOPY WITH PROPOFOL N/A 08/26/2016   Procedure: COLONOSCOPY WITH PROPOFOL;  Surgeon: Lucilla Lame, MD;  Location: [place];  Service: Endoscopy;  Laterality: N/A;  sleep apnea  . HERNIA REPAIR    . POLYPECTOMY  08/26/2016   Procedure: POLYPECTOMY;  Surgeon: Lucilla Lame, MD;  Location: [place];  Service: Endoscopy;;  . 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 TONSILLECTOMY    . UMBILICAL HERNIA REPAIR  2010   in [place]  . VENTRAL HERNIA REPAIR N/A 08/11/2015   Procedure: LAPAROSCOPIC VENTRAL HERNIA WITH MESH ;  Surgeon: [name], MD;  Location: [facility];  Service: General;  Laterality: N/A;    Social History   Tobacco Use  . Smoking status: Never Smoker  . Smokeless tobacco: Never Used  Substance Use Topics  . Alcohol use: Yes    Alcohol/week: 13.0 standard drinks  Types: 3 Cans of beer, 10 Standard drinks or equivalent per week    Comment: OCC  . Drug use: No     Medication list has been reviewed and updated.  Current Meds  Medication Sig  . aspirin EC 81 MG tablet Take 81 mg by mouth daily as needed for mild pain.  Marland Kitchen atorvastatin  (LIPITOR) 10 MG tablet Take 1 tablet (10 mg total) by mouth daily.  . fluticasone (FLONASE) 50 MCG/ACT nasal spray Place 2 sprays into both nostrils every evening.  Marland Kitchen lisinopril (PRINIVIL,ZESTRIL) 10 MG tablet Take 1 tablet (10 mg total) by mouth every morning.  . meloxicam (MOBIC) 15 MG tablet Take 1 tablet (15 mg total) by mouth daily.  . Probiotic Product (PROBIOTIC PO) Take 1 capsule by mouth 2 (two) times a week.   . tamsulosin (FLOMAX) 0.4 MG CAPS capsule Take 0.4 mg by mouth daily as needed. Reported on 04/19/2016  . temazepam (RESTORIL) 15 MG capsule Take 1 capsule (15 mg total) by mouth at bedtime as needed for sleep.    PHQ 2/9 Scores 06/26/2018 06/19/2018 05/30/2017 04/19/2016  PHQ - 2 Score 0 0 0 0  PHQ- 9 Score 0 3 - -    Physical Exam  Constitutional: He is oriented to person, place, and time. He appears well-developed and well-nourished.  HENT:  Head: Normocephalic.  Right Ear: Tympanic membrane, external ear and ear canal normal.  Left Ear: Tympanic membrane, external ear and ear canal normal.  Nose: Nose normal.  Mouth/Throat: Uvula is midline and oropharynx is clear and moist.  Eyes: Pupils are equal, round, and reactive to light. Conjunctivae and EOM are normal.  Neck: Normal range of motion. Neck supple. Carotid bruit is not present. No thyromegaly present.  Cardiovascular: Normal rate, regular rhythm, normal heart sounds and intact distal pulses.  Pulmonary/Chest: Effort normal and breath sounds normal. He has no wheezes. Right breast exhibits no mass. Left breast exhibits no mass.  Abdominal: Soft. Normal appearance and bowel sounds are normal. There is no hepatosplenomegaly. There is no tenderness.  Musculoskeletal: Normal range of motion. He exhibits no edema.  Lymphadenopathy:    He has no cervical adenopathy.  Neurological: He is alert and oriented to person, place, and time. He has normal reflexes.  Skin: Skin is warm, dry and intact.  Psychiatric: He has a normal  mood and affect. His speech is normal and behavior is normal. Judgment and thought content normal.  Nursing note and vitals reviewed.   BP 104/68 (BP Location: Right Arm, Patient Position: Sitting, Cuff Size: Normal)   Pulse (!) 55   Ht 5\' 9"  (1.753 m)   Wt 210 lb (95.3 kg)   SpO2 98%   BMI 31.01 kg/m   Assessment and Plan: 1. Annual physical exam Continue to work on diet and weight loss - POCT urinalysis dipstick  2. Essential (primary) hypertension controlled - lisinopril (PRINIVIL,ZESTRIL) 10 MG tablet; Take 1 tablet (10 mg total) by mouth every morning.  Dispense: 90 tablet; Refill: 1  3. Prostate cancer screening DRE deferred - rec follow up with Urology - PSA  4. Primary insomnia Belsomra 10 mg samples given  5. Mixed hyperlipidemia Continue statin therapy - Comprehensive metabolic panel - Lipid panel  6. ASCVD (arteriosclerotic cardiovascular disease) - Ambulatory referral to Cardiology   Meds ordered this encounter  Medications  . lisinopril (PRINIVIL,ZESTRIL) 10 MG tablet    Sig: Take 1 tablet (10 mg total) by mouth every morning.    Dispense:  90 tablet    Refill:  1    Partially dictated using Editor, commissioning. Any errors are unintentional.  Halina Maidens, MD [facility]  06/26/2018

## 2018-06-27 LAB — LIPID PANEL
Chol/HDL Ratio: 3.8 ratio (ref 0.0–5.0)
Cholesterol, Total: 220 mg/dL — ABNORMAL HIGH (ref 100–199)
HDL: 58 mg/dL (ref >39)
LDL Calculated: 149 mg/dL — ABNORMAL HIGH (ref 0–99)
TRIGLYCERIDES: 64 mg/dL (ref 0–149)
VLDL Cholesterol Cal: 13 mg/dL (ref 5–40)

## 2018-06-27 LAB — COMPREHENSIVE METABOLIC PANEL
ALBUMIN: 4.7 g/dL (ref 3.5–5.5)
ALT: 20 IU/L (ref 0–44)
AST: 17 IU/L (ref 0–40)
Albumin/Globulin Ratio: 2.4 — ABNORMAL HIGH (ref 1.2–2.2)
Alkaline Phosphatase: 66 IU/L (ref 39–117)
BILIRUBIN TOTAL: 1.5 mg/dL — AB (ref 0.0–1.2)
BUN / CREAT RATIO: 15 (ref 9–20)
BUN: 14 mg/dL (ref 6–24)
CHLORIDE: 100 mmol/L (ref 96–106)
CO2: 19 mmol/L — ABNORMAL LOW (ref 20–29)
Calcium: 9.5 mg/dL (ref 8.7–10.2)
Creatinine, Ser: 0.91 mg/dL (ref 0.76–1.27)
GFR calc Af Amer: 110 mL/min/{1.73_m2} (ref >59)
GFR calc non Af Amer: 95 mL/min/{1.73_m2} (ref >59)
GLOBULIN, TOTAL: 2 g/dL (ref 1.5–4.5)
Glucose: 72 mg/dL (ref 65–99)
POTASSIUM: 4.4 mmol/L (ref 3.5–5.2)
SODIUM: 139 mmol/L (ref 134–144)
Total Protein: 6.7 g/dL (ref 6.0–8.5)

## 2018-06-27 LAB — PSA: Prostate Specific Ag, Serum: 0.6 ng/mL (ref 0.0–4.0)

## 2019-01-04 ENCOUNTER — Other Ambulatory Visit: Payer: Self-pay | Admitting: Internal Medicine

## 2019-06-01 ENCOUNTER — Other Ambulatory Visit: Payer: Self-pay | Admitting: Internal Medicine

## 2019-06-01 DIAGNOSIS — I10 Essential (primary) hypertension: Secondary | ICD-10-CM

## 2019-06-14 ENCOUNTER — Other Ambulatory Visit: Payer: Self-pay

## 2019-06-14 ENCOUNTER — Encounter: Payer: Self-pay | Admitting: Internal Medicine

## 2019-06-14 ENCOUNTER — Ambulatory Visit: Payer: Federal, State, Local not specified - PPO | Admitting: Internal Medicine

## 2019-06-14 VITALS — BP 122/78 | HR 75 | Ht 69.0 in | Wt 209.0 lb

## 2019-06-14 DIAGNOSIS — F5101 Primary insomnia: Secondary | ICD-10-CM

## 2019-06-14 DIAGNOSIS — G8929 Other chronic pain: Secondary | ICD-10-CM

## 2019-06-14 DIAGNOSIS — M5441 Lumbago with sciatica, right side: Secondary | ICD-10-CM | POA: Diagnosis not present

## 2019-06-14 NOTE — Progress Notes (Signed)
Date:  06/14/2019   Name:  Isaac Banks   DOB:  [birth year]   MRN:  [id number]   Chief Complaint: Anxiety (GAD7- 9 work related stress.)  Insomnia Primary symptoms: sleep disturbance.  The current episode started more than one month. The problem occurs nightly. The problem is unchanged. The symptoms are aggravated by alcohol, anxiety and work stress. Past treatments include alcohol. The treatment provided no relief. PMH includes: hypertension, work related stressors (recently quit his job - now has to find another).  Back Pain This is a recurrent problem. The problem has been gradually worsening since onset. The pain is present in the lumbar spine. The quality of the pain is described as aching. The pain radiates to the right foot. The pain is mild. Pertinent negatives include no chest pain, fever, numbness, paresthesias, perianal numbness, weakness or weight loss. He has tried NSAIDs, heat and chiropractic manipulation for the symptoms. The treatment provided mild relief.    Review of Systems  Constitutional: Negative for chills, fatigue, fever and weight loss.  Respiratory: Negative for cough, choking and shortness of breath.   Cardiovascular: Negative for chest pain and leg swelling.  Musculoskeletal: Positive for back pain. Negative for joint swelling and myalgias.  Neurological: Negative for weakness, numbness and paresthesias.  Psychiatric/Behavioral: Positive for sleep disturbance. Negative for dysphoric mood. The patient is nervous/anxious and has insomnia.     Patient Active Problem List   Diagnosis Date Noted  . OSA (obstructive sleep apnea) 05/30/2017  . Primary insomnia 02/14/2017  . Polyp of sigmoid colon   . Rectal polyp   . Hyperlipidemia 08/09/2016  . Plantar fasciitis, right 08/08/2016  . Cardiomegaly 08/10/2015  . Benign prostatic hyperplasia with urinary obstruction 02/26/2015  . Essential (primary) hypertension 02/26/2015  . Family history of colon cancer  02/26/2015  . Neoplasm of uncertain behavior of skin 02/26/2015    Allergies  Allergen Reactions  . Codeine Rash    Past Surgical History:  Procedure Laterality Date  . ANAL FISTULOTOMY N/A 02/07/2017   Procedure: ANAL FISTULOTOMY;  Surgeon: Leonie Green, MD;  Location: [facility];  Service: General;  Laterality: N/A;  . APPENDECTOMY    . COLONOSCOPY WITH PROPOFOL N/A 08/26/2016   Procedure: COLONOSCOPY WITH PROPOFOL;  Surgeon: Lucilla Lame, MD;  Location: [place];  Service: Endoscopy;  Laterality: N/A;  sleep apnea  . HERNIA REPAIR    . POLYPECTOMY  08/26/2016   Procedure: POLYPECTOMY;  Surgeon: Lucilla Lame, MD;  Location: [place];  Service: Endoscopy;;  . 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 TONSILLECTOMY    . UMBILICAL HERNIA REPAIR  2010   in [place]  . VENTRAL HERNIA REPAIR N/A 08/11/2015   Procedure: LAPAROSCOPIC VENTRAL HERNIA WITH MESH ;  Surgeon: [name], MD;  Location: [facility];  Service: General;  Laterality: N/A;    Social History   Tobacco Use  . Smoking status: Never Smoker  . Smokeless tobacco: Never Used  Substance Use Topics  . Alcohol use: Yes    Alcohol/week: 13.0 standard drinks    Types: 3 Cans of beer, 10 Standard drinks or equivalent per week    Comment: OCC  . Drug use: No     Medication list has been reviewed and updated.  Current Meds  Medication Sig  . aspirin EC 81 MG tablet Take 81 mg by mouth daily as needed for mild pain.  Marland Kitchen atorvastatin (  LIPITOR) 10 MG tablet Take 1 tablet by mouth once daily  . fluticasone (FLONASE) 50 MCG/ACT nasal spray Place 2 sprays into both nostrils every evening.  Marland Kitchen lisinopril (ZESTRIL) 10 MG tablet TAKE 1 TABLET BY MOUTH ONCE DAILY IN THE MORNING  . meloxicam (MOBIC) 15 MG tablet Take 1 tablet (15 mg total) by mouth daily.  . Probiotic Product (PROBIOTIC PO) Take 1 capsule by mouth 2 (two) times a week.   . tamsulosin (FLOMAX) 0.4  MG CAPS capsule Take 0.4 mg by mouth daily as needed. Reported on 04/19/2016    PHQ 2/9 Scores 06/14/2019 06/26/2018 06/19/2018 05/30/2017  PHQ - 2 Score 0 0 0 0  PHQ- 9 Score 2 0 3 -   GAD 7 : Generalized Anxiety Score 06/14/2019  Nervous, Anxious, on Edge 1  Control/stop worrying 3  Worry too much - different things 3  Trouble relaxing 2  Restless 0  Easily annoyed or irritable 0  Afraid - awful might happen 0  Total GAD 7 Score 9  Anxiety Difficulty Somewhat difficult     BP Readings from Last 3 Encounters:  06/14/19 122/78  06/26/18 104/68  06/19/18 110/80    Physical Exam Vitals signs and nursing note reviewed.  Constitutional:      General: He is not in acute distress.    Appearance: Normal appearance. He is well-developed.  HENT:     Head: Normocephalic and atraumatic.  Neck:     Musculoskeletal: Normal range of motion.  Cardiovascular:     Rate and Rhythm: Normal rate and regular rhythm.     Heart sounds: No murmur.  Pulmonary:     Effort: Pulmonary effort is normal. No respiratory distress.     Breath sounds: No wheezing or rhonchi.  Musculoskeletal:     Lumbar back: He exhibits bony tenderness. He exhibits no spasm.     Comments: SLR negative bilaterally  Skin:    General: Skin is warm and dry.     Findings: No rash.  Neurological:     Mental Status: He is alert and oriented to person, place, and time.     Sensory: Sensation is intact.     Motor: Motor function is intact.     Deep Tendon Reflexes:     Reflex Scores:      Patellar reflexes are 1+ on the right side and 1+ on the left side.      Achilles reflexes are 1+ on the right side and 1+ on the left side. Psychiatric:        Behavior: Behavior normal.        Thought Content: Thought content normal.     Wt Readings from Last 3 Encounters:  06/14/19 209 lb (94.8 kg)  06/26/18 210 lb (95.3 kg)  06/19/18 215 lb (97.5 kg)    BP 122/78   Pulse 75   Ht 5\' 9"  (1.753 m)   Wt 209 lb (94.8 kg)   SpO2  97%   BMI 30.86 kg/m   Assessment and Plan: 1. Primary insomnia Sample of Belsomra 20 mg given -take 10 mg x 2 nights then increase if needed Take 15 minutes before bedtime Reduce alcohol intake to 2 or fewer beers per night  2. Chronic midline low back pain with right-sided sciatica Continue Mobic, heat, chiropractic care Due to persistence of right leg referred pain, will refer to Orthopedics - Ambulatory referral to Orthopedic Surgery   Partially dictated using Dragon software. Any errors are unintentional.  Halina Maidens,  MD [facility]  06/14/2019

## 2019-07-02 ENCOUNTER — Encounter: Payer: Federal, State, Local not specified - PPO | Admitting: Internal Medicine

## 2019-07-30 ENCOUNTER — Other Ambulatory Visit: Payer: Self-pay

## 2019-07-30 ENCOUNTER — Encounter: Payer: Self-pay | Admitting: Internal Medicine

## 2019-07-30 ENCOUNTER — Ambulatory Visit (INDEPENDENT_AMBULATORY_CARE_PROVIDER_SITE_OTHER): Payer: Federal, State, Local not specified - PPO | Admitting: Internal Medicine

## 2019-07-30 VITALS — BP 114/70 | HR 63 | Ht 69.0 in | Wt 208.4 lb

## 2019-07-30 DIAGNOSIS — E782 Mixed hyperlipidemia: Secondary | ICD-10-CM | POA: Diagnosis not present

## 2019-07-30 DIAGNOSIS — N401 Enlarged prostate with lower urinary tract symptoms: Secondary | ICD-10-CM | POA: Diagnosis not present

## 2019-07-30 DIAGNOSIS — G4733 Obstructive sleep apnea (adult) (pediatric): Secondary | ICD-10-CM | POA: Diagnosis not present

## 2019-07-30 DIAGNOSIS — Z0000 Encounter for general adult medical examination without abnormal findings: Secondary | ICD-10-CM

## 2019-07-30 DIAGNOSIS — I10 Essential (primary) hypertension: Secondary | ICD-10-CM | POA: Diagnosis not present

## 2019-07-30 DIAGNOSIS — N138 Other obstructive and reflux uropathy: Secondary | ICD-10-CM

## 2019-07-30 LAB — POCT URINALYSIS DIPSTICK
Bilirubin, UA: NEGATIVE
Blood, UA: NEGATIVE
Glucose, UA: NEGATIVE
Ketones, UA: NEGATIVE
Leukocytes, UA: NEGATIVE
Nitrite, UA: NEGATIVE
Protein, UA: NEGATIVE
Spec Grav, UA: 1.02 (ref 1.010–1.025)
Urobilinogen, UA: 0.2 E.U./dL
pH, UA: 5 (ref 5.0–8.0)

## 2019-07-30 MED ORDER — LISINOPRIL 10 MG PO TABS: 10.0000 mg | ORAL_TABLET | Freq: Every day | ORAL | 3 refills | Status: AC

## 2019-07-30 MED ORDER — ATORVASTATIN CALCIUM 10 MG PO TABS: 10.0000 mg | ORAL_TABLET | Freq: Every day | ORAL | 3 refills | Status: AC

## 2019-07-30 NOTE — Progress Notes (Signed)
Date:  07/30/2019   Name:  Isaac Banks   DOB:  05-Feb-1961   MRN:  [id number]   Chief Complaint: Annual Exam Isaac Banks is a 58 y.o. male who presents today for his Complete Annual Exam. He feels well. He reports exercising some. He reports he is sleeping fairly well. He has OSA but could not tolerate the device.  He has tried sleep medication but not helpful.  He wakes most days feeling tired and unrested.    Colonoscopy 2017  Hypertension This is a chronic problem. The problem is controlled (at home 120/70). Pertinent negatives include no chest pain, headaches, malaise/fatigue, palpitations, peripheral edema or shortness of breath. There are no associated agents to hypertension. The current treatment provides significant improvement.  Hyperlipidemia The problem is controlled. Pertinent negatives include no chest pain, myalgias or shortness of breath. Current antihyperlipidemic treatment includes statins. The current treatment provides significant improvement of lipids.  Benign Prostatic Hypertrophy This is a chronic problem. Irritative symptoms do not include frequency. Obstructive symptoms include incomplete emptying, an intermittent stream and a slower stream. Pertinent negatives include no chills, dysuria or hematuria. Past treatments include tamsulosin. The treatment provided mild relief.   Lab Results  Component Value Date   CREATININE 0.91 06/26/2018   BUN 14 06/26/2018   NA 139 06/26/2018   K 4.4 06/26/2018   CL 100 06/26/2018   CO2 19 (L) 06/26/2018   Lab Results  Component Value Date   CHOL 220 (H) 06/26/2018   HDL 58 06/26/2018   LDLCALC 149 (H) 06/26/2018   TRIG 64 06/26/2018   CHOLHDL 3.8 06/26/2018   Lab Results  Component Value Date   PSA 0.6 04/23/2013     Review of Systems  Constitutional: Negative for appetite change, chills, diaphoresis, fatigue, malaise/fatigue and unexpected weight change.  HENT: Positive for tinnitus (on left from [place]). Negative for hearing loss, trouble swallowing and voice change.   Eyes: Negative for visual disturbance.  Respiratory: Negative for choking, shortness of breath and wheezing.   Cardiovascular: Negative for chest pain, palpitations and leg swelling.  Gastrointestinal: Negative for abdominal pain, blood in stool, constipation and diarrhea.  Genitourinary: Positive for decreased urine volume, difficulty urinating and incomplete emptying. Negative for dysuria, frequency and hematuria.  Musculoskeletal: Negative for arthralgias, back pain and myalgias.  Skin: Negative for color change and rash.  Allergic/Immunologic: Negative for environmental allergies.  Neurological: Negative for dizziness, syncope and headaches.  Hematological: Negative for adenopathy.  Psychiatric/Behavioral: Negative for dysphoric mood and sleep disturbance.    Patient Active Problem List   Diagnosis Date Noted  . Chronic midline low back pain with right-sided sciatica 06/14/2019  . OSA (obstructive sleep apnea) 05/30/2017  . Primary insomnia 02/14/2017  . Polyp of sigmoid colon   . Rectal polyp   . Hyperlipidemia 08/09/2016  . Plantar fasciitis, right 08/08/2016  . Cardiomegaly 08/10/2015  . Benign prostatic hyperplasia with urinary obstruction 02/26/2015  . Essential (primary) hypertension 02/26/2015  . Family history of colon cancer 02/26/2015  . Neoplasm of uncertain behavior of skin 02/26/2015    Allergies  Allergen Reactions  . Codeine Rash    Past Surgical History:  Procedure Laterality Date  . ANAL FISTULOTOMY N/A 02/07/2017   Procedure: ANAL FISTULOTOMY;  Surgeon: Leonie Green, MD;  Location: [facility];  Service: General;  Laterality: N/A;  . APPENDECTOMY    . COLONOSCOPY WITH PROPOFOL N/A 08/26/2016   Procedure: COLONOSCOPY WITH PROPOFOL;  Surgeon: Lucilla Lame, MD;  Location: [place];  Service: Endoscopy;  Laterality: N/A;  sleep apnea  . HERNIA REPAIR    . POLYPECTOMY   08/26/2016   Procedure: POLYPECTOMY;  Surgeon: Lucilla Lame, MD;  Location: [place];  Service: Endoscopy;;  . 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 TONSILLECTOMY    . UMBILICAL HERNIA REPAIR  2010   in [place]  . VENTRAL HERNIA REPAIR N/A 08/11/2015   Procedure: LAPAROSCOPIC VENTRAL HERNIA WITH MESH ;  Surgeon: [name], MD;  Location: [facility];  Service: General;  Laterality: N/A;    Social History   Tobacco Use  . Smoking status: Never Smoker  . Smokeless tobacco: Never Used  Substance Use Topics  . Alcohol use: Yes    Alcohol/week: 13.0 standard drinks    Types: 3 Cans of beer, 10 Standard drinks or equivalent per week    Comment: OCC  . Drug use: No     Medication list has been reviewed and updated.  Current Meds  Medication Sig  . aspirin EC 81 MG tablet Take 81 mg by mouth daily as needed for mild pain.  Marland Kitchen atorvastatin (LIPITOR) 10 MG tablet Take 1 tablet by mouth once daily  . fluticasone (FLONASE) 50 MCG/ACT nasal spray Place 2 sprays into both nostrils every evening.  Marland Kitchen lisinopril (ZESTRIL) 10 MG tablet TAKE 1 TABLET BY MOUTH ONCE DAILY IN THE MORNING  . meloxicam (MOBIC) 15 MG tablet Take 1 tablet (15 mg total) by mouth daily.  . Probiotic Product (PROBIOTIC PO) Take 1 capsule by mouth 2 (two) times a week.   . tamsulosin (FLOMAX) 0.4 MG CAPS capsule Take 0.4 mg by mouth daily as needed. Reported on 04/19/2016    PHQ 2/9 Scores 07/30/2019 06/14/2019 06/26/2018 06/19/2018  PHQ - 2 Score 0 0 0 0  PHQ- 9 Score 5 2 0 3    BP Readings from Last 3 Encounters:  07/30/19 114/70  06/14/19 122/78  06/26/18 104/68    Physical Exam Vitals signs and nursing note reviewed.  Constitutional:      Appearance: Normal appearance. He is well-developed.  HENT:     Head: Normocephalic.     Right Ear: Tympanic membrane, ear canal and external ear normal.     Left Ear: Tympanic membrane, ear canal and external ear normal.      Nose: Nose normal.     Mouth/Throat:     Pharynx: Uvula midline.  Eyes:     Conjunctiva/sclera: Conjunctivae normal.     Pupils: Pupils are equal, round, and reactive to light.  Neck:     Musculoskeletal: Normal range of motion and neck supple.     Thyroid: No thyromegaly.     Vascular: No carotid bruit.  Cardiovascular:     Rate and Rhythm: Normal rate and regular rhythm.     Heart sounds: Normal heart sounds.  Pulmonary:     Effort: Pulmonary effort is normal.     Breath sounds: Normal breath sounds. No wheezing.  Chest:     Breasts:        Right: No mass.        Left: No mass.  Abdominal:     General: Bowel sounds are normal.     Palpations: Abdomen is soft.     Tenderness: There is no abdominal tenderness.  Musculoskeletal: Normal range of motion.     Right lower leg: No edema.  Left lower leg: No edema.  Lymphadenopathy:     Cervical: No cervical adenopathy.  Skin:    General: Skin is warm and dry.     Capillary Refill: Capillary refill takes less than 2 seconds.  Neurological:     General: No focal deficit present.     Mental Status: He is alert and oriented to person, place, and time.     Gait: Gait normal.     Deep Tendon Reflexes: Reflexes are normal and symmetric.  Psychiatric:        Speech: Speech normal.        Behavior: Behavior normal.        Thought Content: Thought content normal.        Judgment: Judgment normal.     Wt Readings from Last 3 Encounters:  07/30/19 208 lb 6.4 oz (94.5 kg)  06/14/19 209 lb (94.8 kg)  06/26/18 210 lb (95.3 kg)    BP 114/70   Pulse 63   Ht 5\' 9"  (1.753 m)   Wt 208 lb 6.4 oz (94.5 kg)   SpO2 96%   BMI 30.78 kg/m   Assessment and Plan: 1. Annual physical exam Normal exam Continue healthy diet, begin regular exercise - POCT urinalysis dipstick  2. Essential (primary) hypertension Clinically stable exam with well controlled BP.   Tolerating medications, lisinopril 10 mg, without side effects at this  time. Pt to continue current regimen and low sodium diet; benefits of regular exercise as able discussed. - CBC with Differential/Platelet - Comprehensive metabolic panel - lisinopril (ZESTRIL) 10 MG tablet; Take 1 tablet (10 mg total) by mouth daily.  Dispense: 90 tablet; Refill: 3  3. Mixed hyperlipidemia Tolerating statin medication without side effects at this time LDL is at goal of < 70 on current dose Continue same therapy without change at this time. - Lipid panel  4. Benign prostatic hyperplasia with urinary obstruction Not taking meds regularly.  Needs follow up with Urology - seen by Dr. Jacqlyn Larsen. Pt declines DRE today - PSA  5. OSA (obstructive sleep apnea) Untreated with HTN and non-refreshing sleep Intolerant of sleep equipment so will refer to ENT for evaluation for possible surgery - Ambulatory referral to ENT   Partially dictated using Dragon software. Any errors are unintentional.  Halina Maidens, MD [facility]  07/30/2019

## 2019-07-30 NOTE — Patient Instructions (Signed)
Schedule follow up with Urology.

## 2019-07-31 LAB — CBC WITH DIFFERENTIAL/PLATELET
Basophils Absolute: 0.1 10*3/uL (ref 0.0–0.2)
Basos: 1 %
EOS (ABSOLUTE): 0.3 10*3/uL (ref 0.0–0.4)
Eos: 4 %
Hematocrit: 44.5 % (ref 37.5–51.0)
Hemoglobin: 15.3 g/dL (ref 13.0–17.7)
Immature Grans (Abs): 0.1 10*3/uL (ref 0.0–0.1)
Immature Granulocytes: 1 %
Lymphocytes Absolute: 2.4 10*3/uL (ref 0.7–3.1)
Lymphs: 30 %
MCH: 30.5 pg (ref 26.6–33.0)
MCHC: 34.4 g/dL (ref 31.5–35.7)
MCV: 89 fL (ref 79–97)
Monocytes Absolute: 0.5 10*3/uL (ref 0.1–0.9)
Monocytes: 7 %
Neutrophils Absolute: 4.6 10*3/uL (ref 1.4–7.0)
Neutrophils: 57 %
Platelets: 232 10*3/uL (ref 150–450)
RBC: 5.02 x10E6/uL (ref 4.14–5.80)
RDW: 13.1 % (ref 11.6–15.4)
WBC: 8 10*3/uL (ref 3.4–10.8)

## 2019-07-31 LAB — COMPREHENSIVE METABOLIC PANEL
ALT: 27 IU/L (ref 0–44)
AST: 17 IU/L (ref 0–40)
Albumin/Globulin Ratio: 2.6 — ABNORMAL HIGH (ref 1.2–2.2)
Albumin: 4.4 g/dL (ref 3.8–4.9)
Alkaline Phosphatase: 62 IU/L (ref 39–117)
BUN/Creatinine Ratio: 17 (ref 9–20)
BUN: 14 mg/dL (ref 6–24)
Bilirubin Total: 1.2 mg/dL (ref 0.0–1.2)
CO2: 23 mmol/L (ref 20–29)
Calcium: 9.3 mg/dL (ref 8.7–10.2)
Chloride: 105 mmol/L (ref 96–106)
Creatinine, Ser: 0.84 mg/dL (ref 0.76–1.27)
GFR calc Af Amer: 113 mL/min/{1.73_m2} (ref >59)
GFR calc non Af Amer: 98 mL/min/{1.73_m2} (ref >59)
Globulin, Total: 1.7 g/dL (ref 1.5–4.5)
Glucose: 99 mg/dL (ref 65–99)
Potassium: 4.6 mmol/L (ref 3.5–5.2)
Sodium: 139 mmol/L (ref 134–144)
Total Protein: 6.1 g/dL (ref 6.0–8.5)

## 2019-07-31 LAB — LIPID PANEL
Chol/HDL Ratio: 3.1 ratio (ref 0.0–5.0)
Cholesterol, Total: 171 mg/dL (ref 100–199)
HDL: 56 mg/dL (ref >39)
LDL Chol Calc (NIH): 101 mg/dL — ABNORMAL HIGH (ref 0–99)
Triglycerides: 73 mg/dL (ref 0–149)
VLDL Cholesterol Cal: 14 mg/dL (ref 5–40)

## 2019-07-31 LAB — PSA: Prostate Specific Ag, Serum: 0.4 ng/mL (ref 0.0–4.0)

## 2019-08-29 ENCOUNTER — Other Ambulatory Visit: Payer: Self-pay | Admitting: Internal Medicine

## 2019-08-29 DIAGNOSIS — M545 Low back pain, unspecified: Secondary | ICD-10-CM

## 2019-08-29 DIAGNOSIS — G8929 Other chronic pain: Secondary | ICD-10-CM

## 2020-02-11 IMAGING — CR DG LUMBAR SPINE COMPLETE 4+V
5 series · 5 of 5 positions shown · non-contrast
Comparison: No recent.

CLINICAL DATA: Left lower back pain.  Left hip pain

EXAM:
LUMBAR SPINE - COMPLETE 4+ VIEW

[l-spine ap]
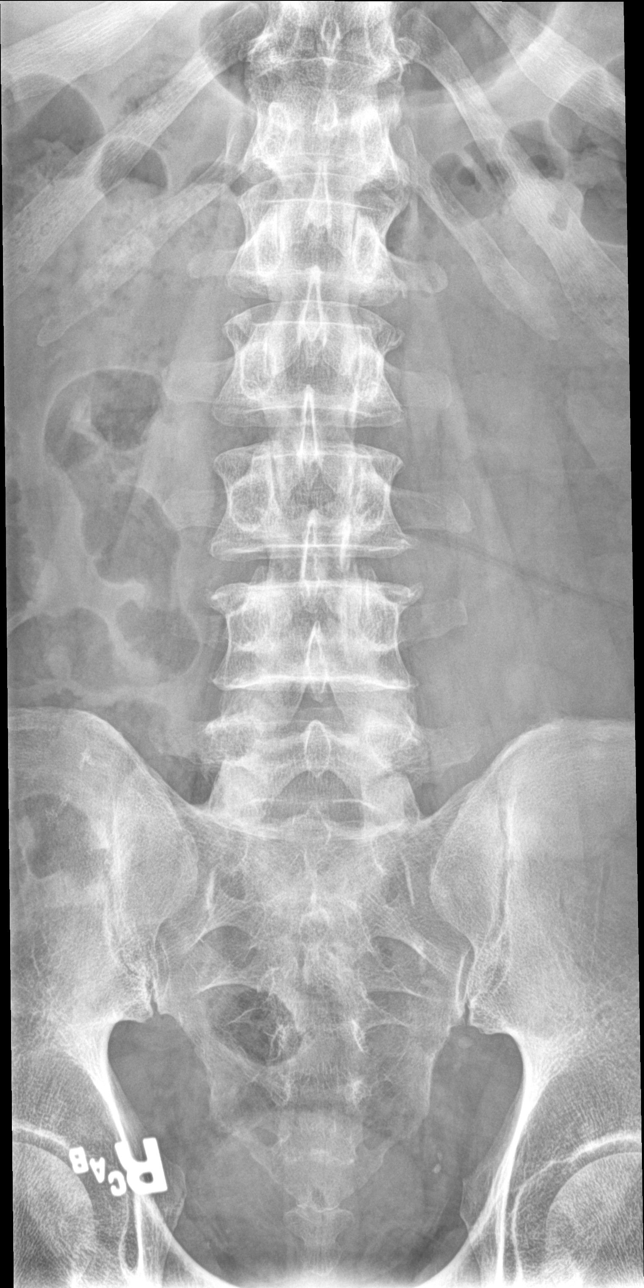

[l-spine obl (1 of 2)]
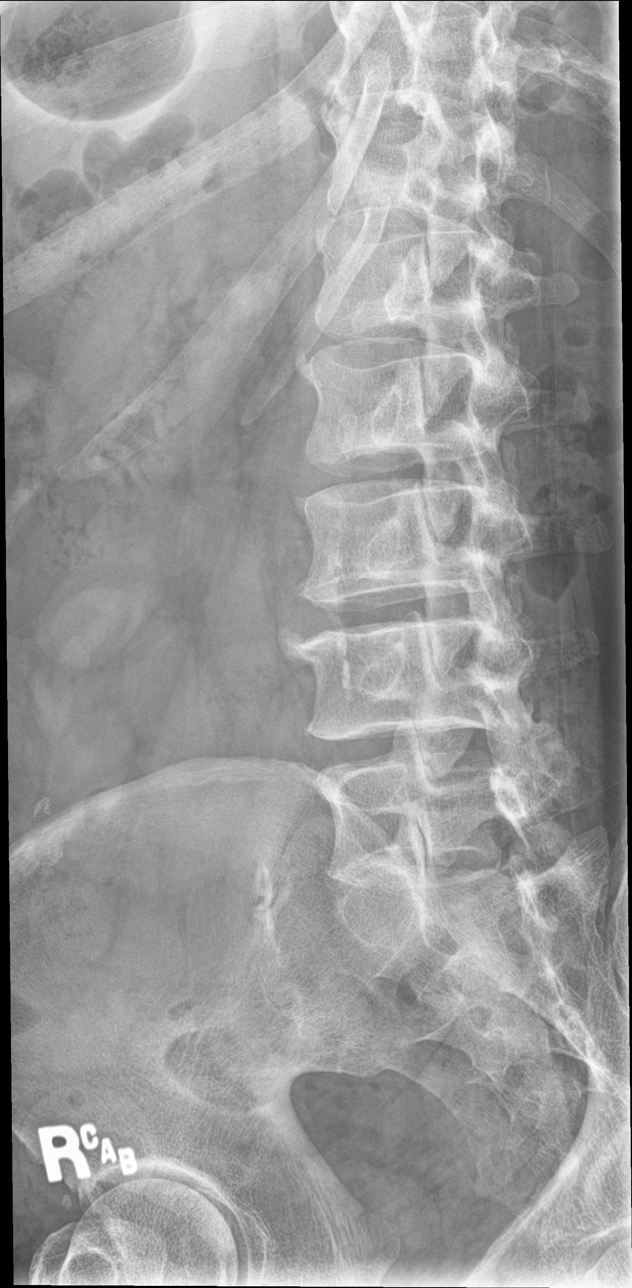

[l-spine obl (2 of 2)]
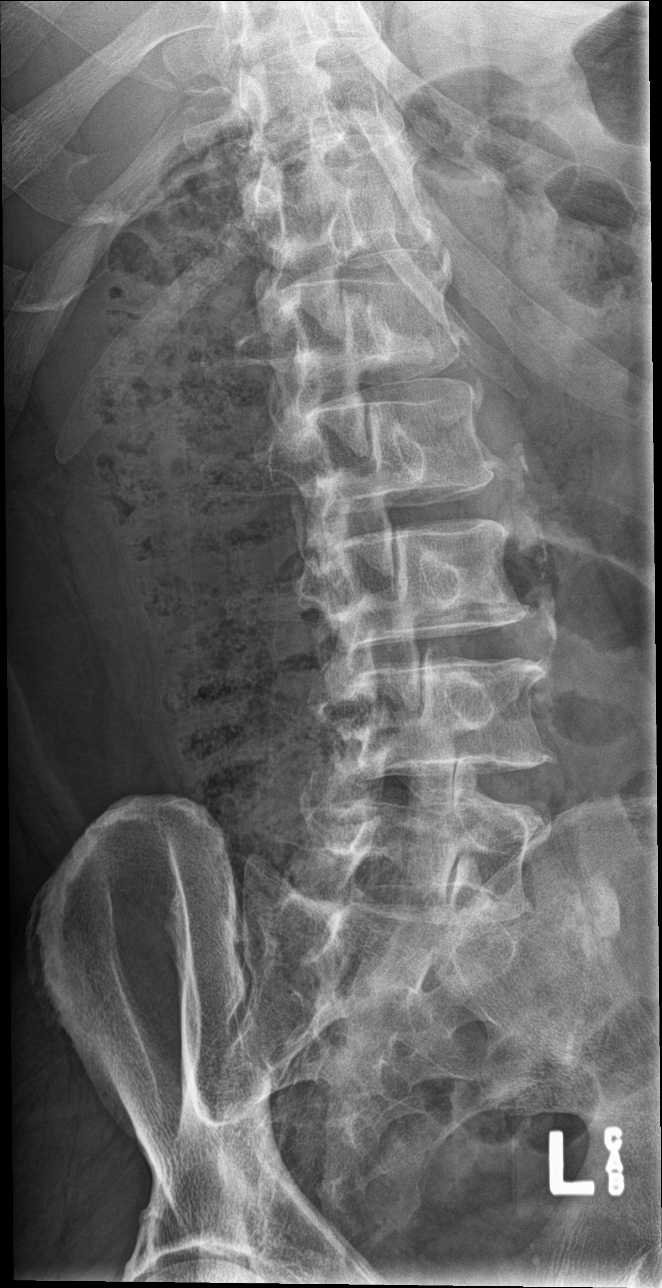

[l-spine lat]
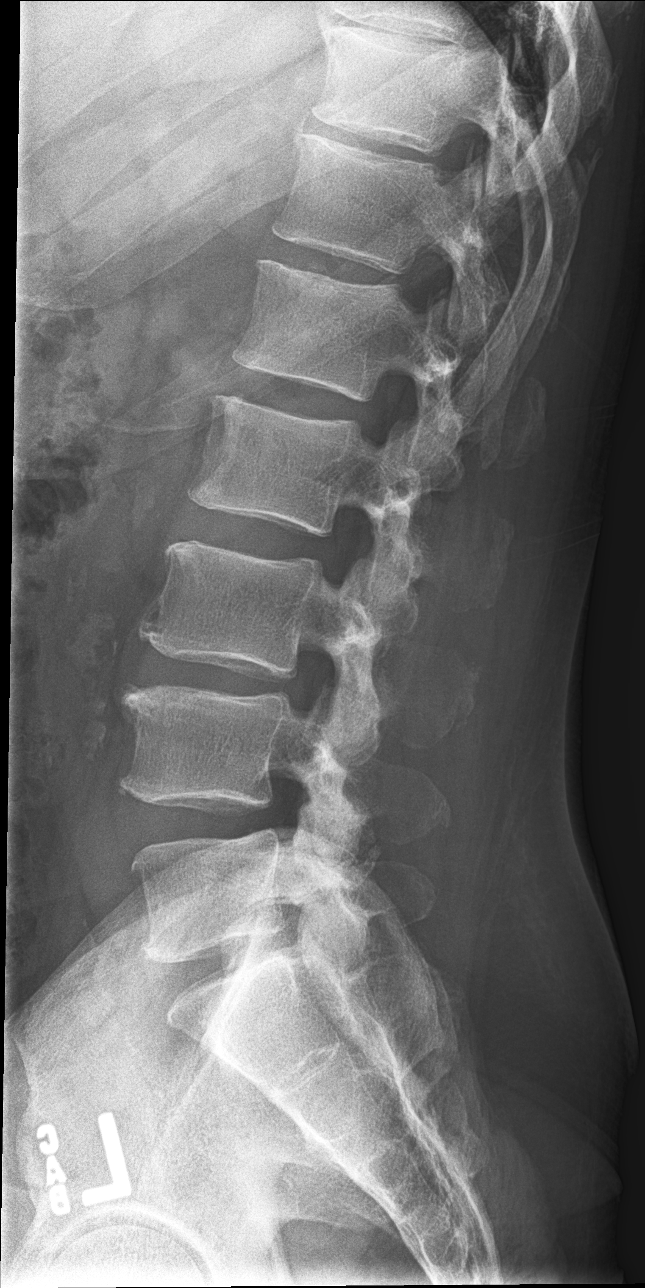

[l-spine spot]
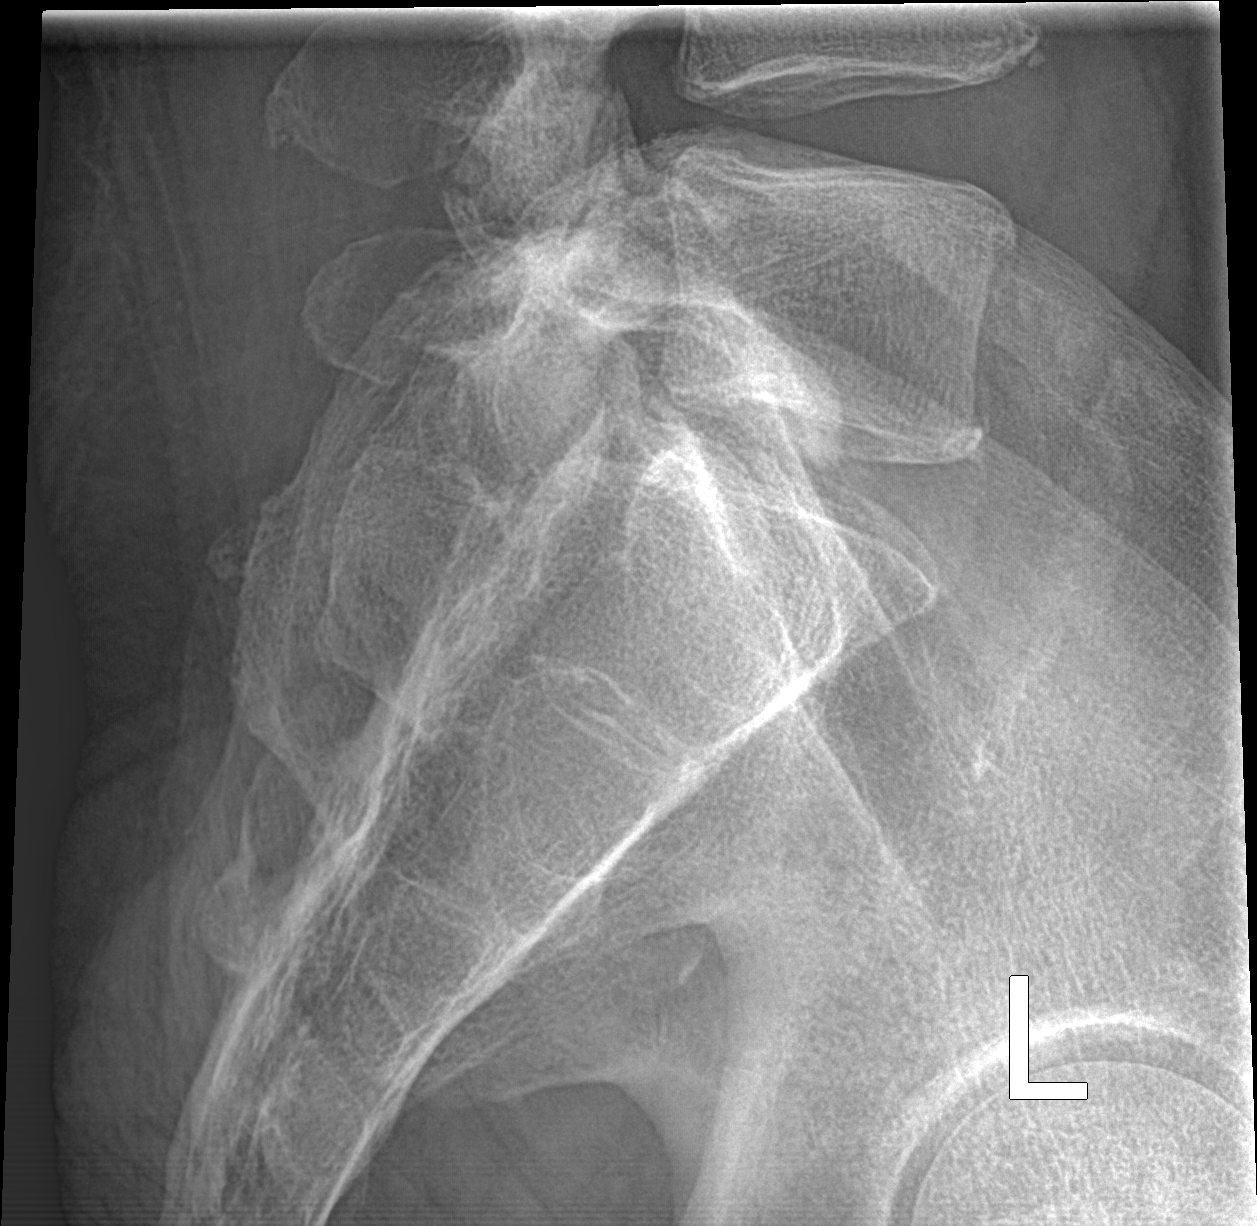

[5 of 5 positions shown; findings below may reference images not displayed]

FINDINGS: No acute abnormality identified. No acute or focal bony abnormality.
Aortoiliac atherosclerotic vascular calcification.
IMPRESSION: 1.  No acute abnormality identified.  Diffuse degenerative change.

2.  Aortoiliac atherosclerotic vascular disease.

## 2020-08-04 ENCOUNTER — Encounter: Payer: Federal, State, Local not specified - PPO | Admitting: Internal Medicine
